# Patient Record
Sex: Female | Born: 1965 | Race: Black or African American | Hispanic: No | Marital: Married | Smoking: Never smoker
Health system: Southern US, Community
[De-identification: ages and names within clinical notes are randomized; demographics above are authoritative.]

## PROBLEM LIST (undated history)

## (undated) DIAGNOSIS — O1490 Unspecified pre-eclampsia, unspecified trimester: Secondary | ICD-10-CM

## (undated) DIAGNOSIS — I10 Essential (primary) hypertension: Secondary | ICD-10-CM

## (undated) HISTORY — DX: Essential (primary) hypertension: I10

## (undated) HISTORY — PX: REDUCTION MAMMAPLASTY: SUR839

## (undated) HISTORY — DX: Unspecified pre-eclampsia, unspecified trimester: O14.90

---

## 2010-04-04 ENCOUNTER — Emergency Department (HOSPITAL_BASED_OUTPATIENT_CLINIC_OR_DEPARTMENT_OTHER): Admission: EM | Admit: 2010-04-04 | Discharge: 2010-04-04 | Payer: Self-pay | Admitting: Emergency Medicine

## 2010-08-07 ENCOUNTER — Ambulatory Visit: Payer: Self-pay | Admitting: Family Medicine

## 2010-08-07 ENCOUNTER — Encounter: Payer: Self-pay | Admitting: Family Medicine

## 2010-08-07 DIAGNOSIS — R42 Dizziness and giddiness: Secondary | ICD-10-CM

## 2010-08-07 DIAGNOSIS — I951 Orthostatic hypotension: Secondary | ICD-10-CM | POA: Insufficient documentation

## 2010-08-08 ENCOUNTER — Telehealth (INDEPENDENT_AMBULATORY_CARE_PROVIDER_SITE_OTHER): Payer: Self-pay | Admitting: *Deleted

## 2010-08-08 LAB — CONVERTED CEMR LAB
BUN: 8 mg/dL (ref 6–23)
Basophils Relative: 0.4 % (ref 0.0–3.0)
Chloride: 105 meq/L (ref 96–112)
Eosinophils Absolute: 0.1 10*3/uL (ref 0.0–0.7)
Eosinophils Relative: 1.2 % (ref 0.0–5.0)
Glucose, Bld: 72 mg/dL (ref 70–99)
Lymphocytes Relative: 43.8 % (ref 12.0–46.0)
Neutrophils Relative %: 48.3 % (ref 43.0–77.0)
Potassium: 4.7 meq/L (ref 3.5–5.1)
RBC: 4.46 M/uL (ref 3.87–5.11)
Sodium: 140 meq/L (ref 135–145)
TSH: 1.27 microintl units/mL (ref 0.35–5.50)
WBC: 7.7 10*3/uL (ref 4.5–10.5)

## 2010-08-19 ENCOUNTER — Telehealth: Payer: Self-pay | Admitting: Family Medicine

## 2010-10-28 NOTE — Progress Notes (Signed)
Summary: still dizzy  Phone Note Call from Patient Call back at Work Phone [phone]   Caller: Patient Summary of Call: Pt states that she is still struggling with dizziness. Pt would like to know what is the next step. Pls advise.........Marland KitchenFelecia Deloach CMA  August 19, 2010 8:26 AM    Follow-up for Phone Call        are pt's sxs the same as when she was here before?  has she increased fluid intake, changed diet as discussed at OV?  if still dizzy despite these changes [facility] w/u. Follow-up by: Neena Rhymes MD,  August 19, 2010 8:47 AM  Additional Follow-up for Phone Call Additional follow up Details #1::        pt aware referral put in awaiting appt info.............Marland KitchenFelecia Deloach CMA  August 19, 2010 9:05 AM

## 2010-10-28 NOTE — Progress Notes (Signed)
Summary: 11-11--lab results  Phone Note Outgoing Call   Call placed by: Jeremy Johann CMA,  August 08, 2010 8:34 AM Details for Reason: labs normal.  no anemia, thyroid abnormality, or electrolyte problem that would explain dizziness Summary of Call: left message to call office ..............Marland KitchenFelecia Deloach CMA  August 08, 2010 8:34 AM   Follow-up for Phone Call        will mail information ............Marland KitchenDoristine Devoid CMA  August 11, 2010 3:37 PM

## 2010-10-28 NOTE — Assessment & Plan Note (Signed)
Summary: new to est//dizziness//lch   Vital Signs:  Patient profile:   45 year old female Weight:      154 pounds O2 Sat:      100 % on Room air Temp:     98.2 degrees F oral Pulse rate:   65 / minute BP supine:   140 / 82  (right arm) BP sitting:   130 / 86  (left arm) BP standing:   120 / 82  (left arm)  Vitals Entered By: Jeremy Johann CMA (August 07, 2010 1:14 PM)  O2 Flow:  Room air CC: new to establish, dizziness, trouble with vision x2 weeks Comments orthostatic vitals: standing pulse 82, o2 100, laying pulse 67 o2 97   History of Present Illness: 45 yo woman here today to establish care.  previous MD- Loralyn Freshwater, [facility] (last seen there Monday).  elevated BP- developed pre-eclampsia during 1st pregnancy.  has been on and off meds for 10 yrs.  recently on Bystolic.  developed dizziness on 10/27 while getting off an elevator.  then dizziness persisted throughout the day.  would have some improvement in the evenings.  stopped meds on 11/4.  was also having nasal congestion so started on Allegra.  still having dizziness.  able to run on treadmill in the AM w/out difficulty.  dizziness will worsen between 2-3pm.  BP has remained normal off the meds.  dizziness worse w/ position changes.  drinks 32 oz of water daily.  admits to snacking on sweets frequently.  Preventive Screening-Counseling & Management  Alcohol-Tobacco     Alcohol drinks/day: 0     Smoking Status: never  Caffeine-Diet-Exercise     Does Patient Exercise: yes     Type of exercise: treadmill     Times/week: 3      Sexual History:  currently monogamous.        Drug Use:  never.    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 No apparent alopecia or balding. Eyes:  PERRL, EOMI, fundi WNL Ears:  External ear exam shows no significant lesions or deformities.  Otoscopic examination reveals clear canals, tympanic membranes are intact bilaterally without bulging, retraction, inflammation or discharge. Hearing is grossly normal bilaterally. Nose:  External nasal examination shows no deformity or inflammation. Nasal mucosa are pink and moist without lesions or exudates. Neck:  No deformities, masses, or tenderness noted. Lungs:  Normal respiratory effort, chest expands symmetrically. Lungs are clear to auscultation, no crackles or wheezes. Heart:  Normal rate and regular rhythm. S1 and S2 normal without gallop, murmur, click, rub or other extra sounds. Pulses:  +2 carotid, radial, DP Extremities:  no C/C/E Neurologic:  No cranial nerve deficits noted. Station and gait are normal. Plantar reflexes are down-going bilaterally. DTRs are symmetrical throughout. Sensory, motor and coordinative functions appear intact.   Impression & Recommendations:  Problem # 1:  HYPOTENSION-ORTHOSTATIC (ICD-458.0)  Assessment New BP drops 20 pts between lying and standing.  EKG normal.  check CBC to r/o anemia.  encouraged pt to increase fluid intake and change positions slowly. Orders: Venipuncture (33295) Specimen Handling (18841) EKG w/ Interpretation (93000) TLB-CBC Platelet - w/Differential (85025-CBCD)  Problem # 2:  DIZZINESS (ICD-780.4) Assessment: New likely due to orthostasis but may also be related to pt's  poor eating habits and glucose fluctuations.  check TSH to r/o thyroid d/o and BMP to r/o problems w/ electrolytes.  encouraged regular snacking w/ some sort of protein and avoiding simple sugars that will cause large fluctuations in blood sugar.  will follow. Orders: Specimen Handling (66063) EKG w/ Interpretation (93000) TLB-BMP (Basic Metabolic Panel-BMET) (80048-METABOL) TLB-TSH (Thyroid Stimulating Hormone) (84443-TSH)  Patient Instructions: 1)  Follow up in 3-4 weeks to recheck your dizziness 2)  Increase your fluid intake 3)  Change positions slowly 4)  Eat a mid morning and mid afternoon snack that includes protein- PB crackers, crackers and cheese, fruit and PB 5)  Try and avoid eating sweets at these times b/c it will cause rapid shifts in blood sugar 6)  We'll notify you of your lab results 7)  Welcome!  We're glad to have you!   Orders Added: 1)  Venipuncture [01601] 2)  Specimen Handling [99000] 3)  EKG w/ Interpretation [93000] 4)  TLB-CBC Platelet - w/Differential [85025-CBCD] 5)  TLB-BMP (Basic Metabolic Panel-BMET) [80048-METABOL] 6)  TLB-TSH (Thyroid Stimulating Hormone) [84443-TSH] 7)  New Patient Level II [09323]

## 2011-02-27 ENCOUNTER — Ambulatory Visit (INDEPENDENT_AMBULATORY_CARE_PROVIDER_SITE_OTHER): Payer: BC Managed Care – PPO | Admitting: Family Medicine

## 2011-02-27 ENCOUNTER — Encounter: Payer: Self-pay | Admitting: *Deleted

## 2011-02-27 ENCOUNTER — Ambulatory Visit (HOSPITAL_BASED_OUTPATIENT_CLINIC_OR_DEPARTMENT_OTHER)
Admission: RE | Admit: 2011-02-27 | Discharge: 2011-02-27 | Disposition: A | Discharge status: Home / Self Care | Payer: BC Managed Care – PPO | Source: Ambulatory Visit | Attending: Family Medicine | Admitting: Family Medicine

## 2011-02-27 DIAGNOSIS — R2230 Localized swelling, mass and lump, unspecified upper limb: Secondary | ICD-10-CM | POA: Insufficient documentation

## 2011-02-27 DIAGNOSIS — R229 Localized swelling, mass and lump, unspecified: Secondary | ICD-10-CM

## 2011-02-27 DIAGNOSIS — I10 Essential (primary) hypertension: Secondary | ICD-10-CM | POA: Insufficient documentation

## 2011-02-27 MED ORDER — HYDROCHLOROTHIAZIDE 12.5 MG PO CAPS: 12.5000 mg | ORAL_CAPSULE | Freq: Every day | ORAL | Status: DC

## 2011-02-27 NOTE — Progress Notes (Signed)
  Subjective:    Patient ID: Peggy Duncan, female    DOB: [birth year], 45 y.o.   MRN: [id number]  HPI Elevated BP-  Reports BP has been 'steadily increasing over the last 2 months'.  Is taking BP at work.  Highest reading was 157.  Reports work is very stressful for her.  Has previously been on BP meds.  No CP, SOB, HAs, visual changes.  Some R ankle edema.  Swelling under R arm- first noticed 6 weeks ago in shower.  Not painful.  Was more noticeable around menses.  Concerned b/c recently multiple friends and family members have been dx'd w/ cancer.  Had recent mammo that was normal   Review of Systems For ROS see HPI     Objective:   Physical Exam  Constitutional: She is oriented to person, place, and time. She appears well-developed and well-nourished. No distress.  HENT:  Head: Normocephalic and atraumatic.  Eyes: Conjunctivae and EOM are normal. Pupils are equal, round, and reactive to light.  Neck: Normal range of motion. Neck supple. No thyromegaly present.  Cardiovascular: Normal rate, regular rhythm, normal heart sounds and intact distal pulses.   No murmur heard. Pulmonary/Chest: Effort normal and breath sounds normal. No respiratory distress.  Abdominal: Soft. She exhibits no distension. There is no tenderness.  Musculoskeletal: She exhibits no edema.  Lymphadenopathy:    She has no cervical adenopathy.       Right: No inguinal and no supraclavicular adenopathy present.       Left: No inguinal and no supraclavicular adenopathy present.       No obvious LAD but diffuse soft tissue mass/swelling in R axilla, ~ 3.5 cm.  Nontender.  Neurological: She is alert and oriented to person, place, and time.  Skin: Skin is warm and dry.  Psychiatric: She has a normal mood and affect. Her behavior is normal.          Assessment & Plan:

## 2011-02-27 NOTE — Patient Instructions (Signed)
Follow up in 3 weeks to recheck your blood pressure and check your lab work Someone will call you with your ultrasound appt Start the Hydrochlorothiazide daily for blood pressure Drink plenty of fluids Call with any questions or concerns Hang in there!!!

## 2011-03-01 NOTE — Assessment & Plan Note (Addendum)
Discussed w/ pt the possibility of LAD, lipoma, extraneous breast tissue.  Will get [place] to assess.  No need for abx as it is not red, warm or tender.  Will determine next step in plan based on [place] report.  Pt expressed understanding and is in agreement w/ plan.

## 2011-03-01 NOTE — Assessment & Plan Note (Signed)
Given that pt's BP has been persistently elevated at work will start low dose HCTZ and monitor closely.

## 2011-12-09 ENCOUNTER — Encounter: Payer: Self-pay | Admitting: Family Medicine

## 2011-12-09 ENCOUNTER — Ambulatory Visit (INDEPENDENT_AMBULATORY_CARE_PROVIDER_SITE_OTHER): Payer: BC Managed Care – PPO | Admitting: Family Medicine

## 2011-12-09 VITALS — BP 120/77 | HR 93 | Temp 98.4°F | Ht 64.5 in | Wt 161.6 lb

## 2011-12-09 DIAGNOSIS — L723 Sebaceous cyst: Secondary | ICD-10-CM

## 2011-12-09 DIAGNOSIS — IMO0002 Reserved for concepts with insufficient information to code with codable children: Secondary | ICD-10-CM

## 2011-12-09 MED ORDER — AMOXICILLIN-POT CLAVULANATE 875-125 MG PO TABS: 1.0000 | ORAL_TABLET | Freq: Two times a day (BID) | ORAL | Status: AC

## 2011-12-09 NOTE — Assessment & Plan Note (Signed)
New.  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  These are often hormonally sensitive.  Differential dx includes LAD.  Start abx to see if tenderness improves.  Offered surgical consult- pt not interested at this time.  Will continue to follow.

## 2011-12-09 NOTE — Patient Instructions (Signed)
Schedule your complete physical for June Start the augmentin twice daily- take w/ food Hot compresses to the area to help w/ swelling Sometimes these drain- that's ok Call with any questions or concerns Hang in there!!!

## 2011-12-09 NOTE — Progress Notes (Signed)
  Subjective:    Patient ID: Peggy Duncan, female    DOB: July 05, 1966, 46 y.o.   MRN: [id number]  HPI Lump in groin- 1st noticed 1 month ago.  Resolved spontaneously.  Returned.  Occurs near menses.  Not painful but irritates along panty line.  No drainage.  No redness.  No fever.  No hx of similar.  No other areas.   Review of Systems For ROS see HPI     Objective:   Physical Exam  Constitutional: She appears well-developed and well-nourished. No distress.  Skin: Skin is warm and dry.       1 cm firm, round area in L inguinal crease consistent w/ follicular cyst vs LAD.  Mildly TTP.  No overlying redness.          Assessment & Plan:

## 2012-03-01 ENCOUNTER — Ambulatory Visit (INDEPENDENT_AMBULATORY_CARE_PROVIDER_SITE_OTHER): Payer: BC Managed Care – PPO | Admitting: Family Medicine

## 2012-03-01 VITALS — BP 124/85 | HR 73 | Temp 98.8°F | Ht 63.5 in | Wt 163.8 lb

## 2012-03-01 DIAGNOSIS — J040 Acute laryngitis: Secondary | ICD-10-CM | POA: Insufficient documentation

## 2012-03-01 DIAGNOSIS — J302 Other seasonal allergic rhinitis: Secondary | ICD-10-CM

## 2012-03-01 DIAGNOSIS — J309 Allergic rhinitis, unspecified: Secondary | ICD-10-CM

## 2012-03-01 MED ORDER — BENZONATATE 200 MG PO CAPS: 200.0000 mg | ORAL_CAPSULE | Freq: Three times a day (TID) | ORAL | Status: AC | PRN

## 2012-03-01 NOTE — Assessment & Plan Note (Signed)
New.  Likely due to PND.  Reviewed supportive care and red flags that should prompt return.  Pt expressed understanding and is in agreement w/ plan.

## 2012-03-01 NOTE — Progress Notes (Signed)
  Subjective:    Patient ID: Peggy Duncan, female    DOB: 1966/05/30, 46 y.o.   MRN: [id number]  HPI Laryngitis- started Saturday, some increased cough.  Cough initially started 3 weeks ago and saw RN at work and was told it PND.  Was taking allergy meds and sxs cleared up.  Stopped meds 1 week ago and sxs returned.  No fevers.  No throat pain.  + cough, particularly at night.  No ear pain, facial pain.   Review of Systems For ROS see HPI     Objective:   Physical Exam  Vitals reviewed. Constitutional: She appears well-developed and well-nourished. No distress.  HENT:  Head: Normocephalic and atraumatic.  Right Ear: Tympanic membrane normal.  Left Ear: Tympanic membrane normal.  Nose: Mucosal edema and rhinorrhea present. Right sinus exhibits no maxillary sinus tenderness and no frontal sinus tenderness. Left sinus exhibits no maxillary sinus tenderness and no frontal sinus tenderness.  Mouth/Throat: Mucous membranes are normal. Posterior oropharyngeal erythema (w/ PND) present.  Eyes: Conjunctivae and EOM are normal. Pupils are equal, round, and reactive to light.  Neck: Normal range of motion. Neck supple.  Cardiovascular: Normal rate, regular rhythm and normal heart sounds.   Pulmonary/Chest: Effort normal and breath sounds normal. No respiratory distress. She has no wheezes. She has no rales.  Lymphadenopathy:    She has no cervical adenopathy.          Assessment & Plan:

## 2012-03-01 NOTE — Assessment & Plan Note (Signed)
New.  Likely the cause of her laryngitis.  Restart OTC antihistamine.  Reviewed supportive care and red flags that should prompt return.  Pt expressed understanding and is in agreement w/ plan.

## 2012-03-01 NOTE — Patient Instructions (Signed)
This is all due to your post nasal drip/allergies Restart the Claritin or Zyrtec daily Drink plenty of fluids Rest your voice as much as possible Alternate tylenol and ibuprofen as needed for pain/hoarseness Take the cough pills as needed Call with any questions or concerns Happy early birthday!!!

## 2012-03-20 IMAGING — US US EXTREM UP *R* LTD
1 series · 14 of 15 positions shown · non-contrast
Comparison: None.

CLINICAL DATA: Right axillary mass.

ULTRASOUND RIGHT UPPER EXTREMITY COMPLETE
TECHNIQUE: Ultrasound examination of the right axilla was
performed including evaluation of the muscles, tendons, joint, and
adjacent soft tissues.

[Series 1: us extrem up *right* ltd · 0.07mm/px · 14 of 15 slices shown]
[im 1/15]
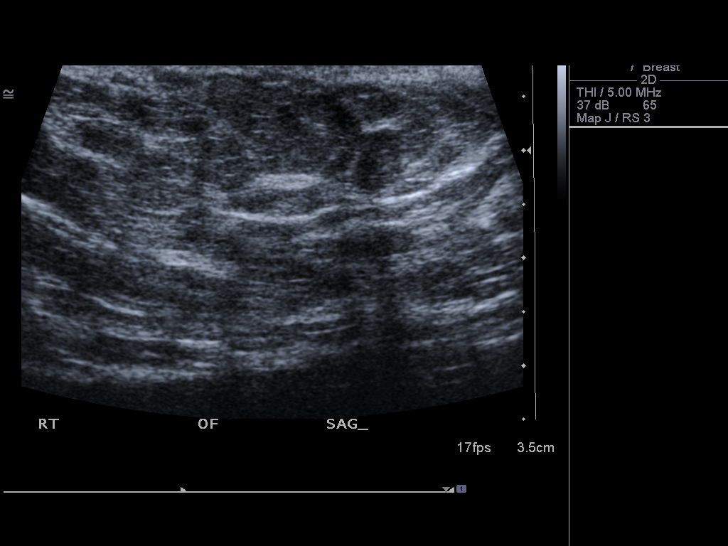
[im 2/15]
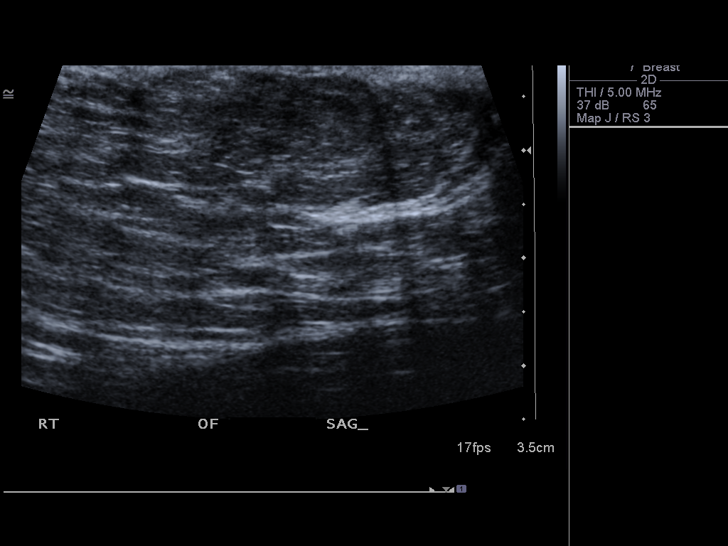
[im 3/15]
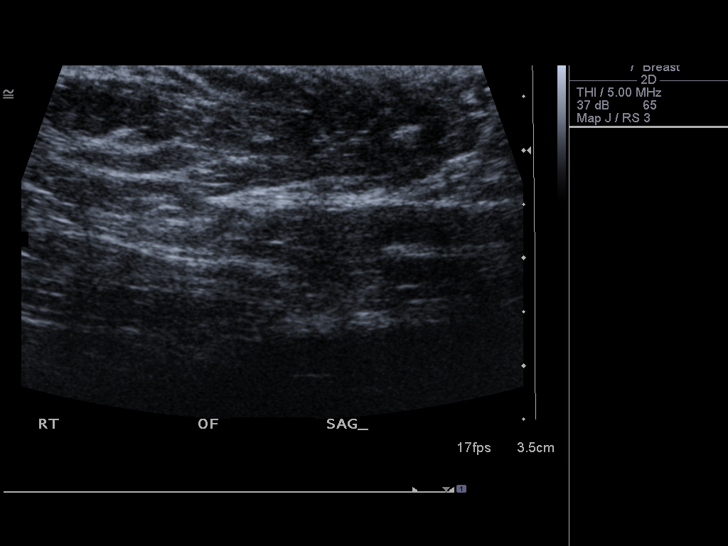
[im 4/15]
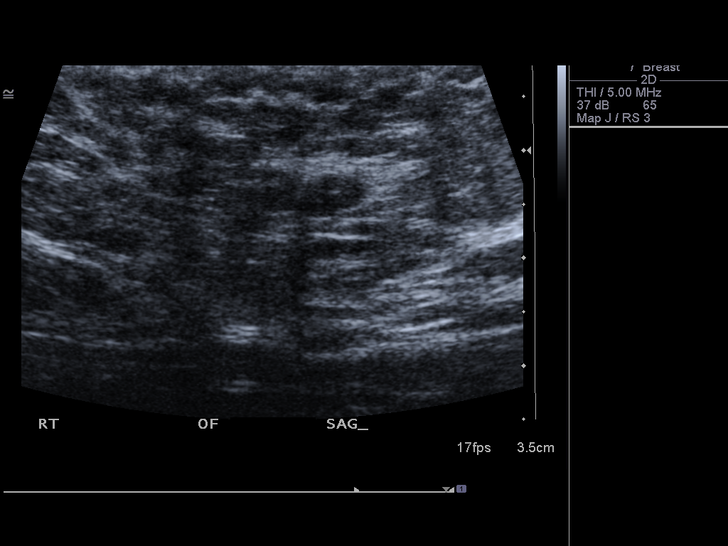
[im 5/15]
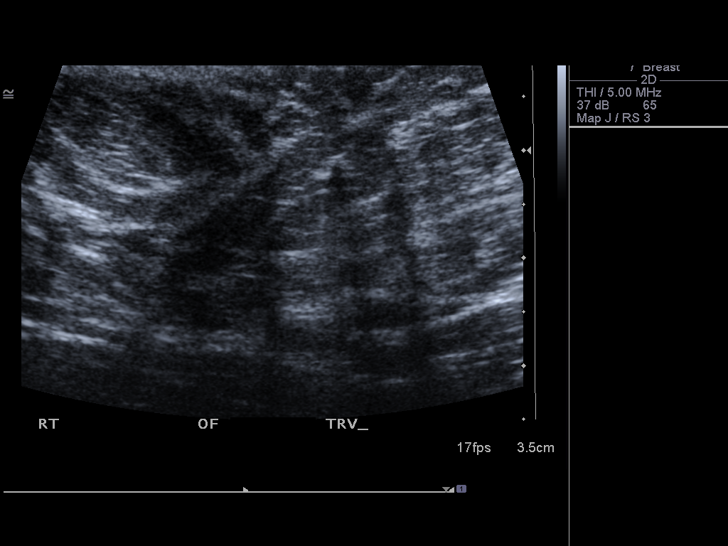
[im 6/15]
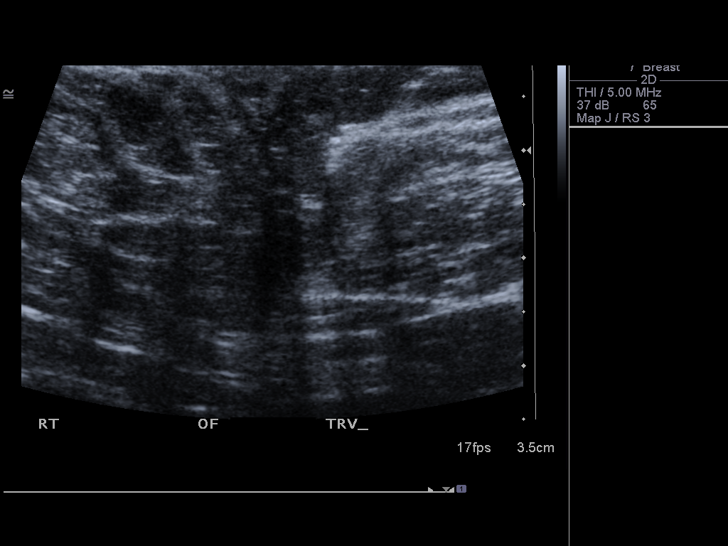
[im 7/15]
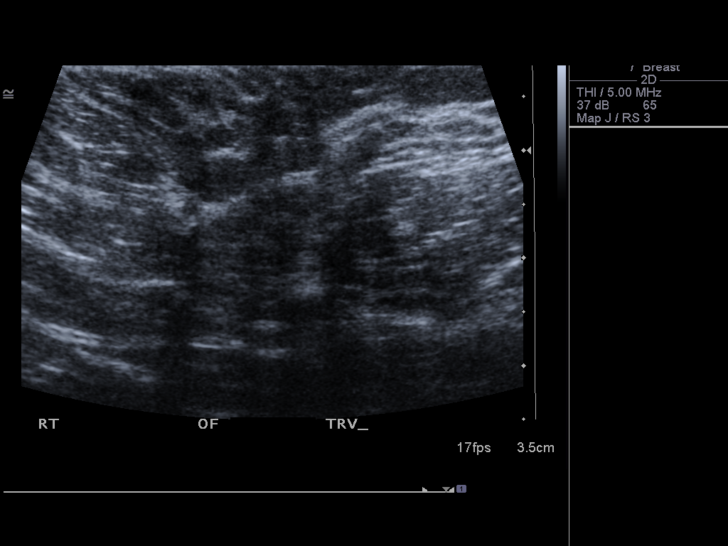
[im 9/15]
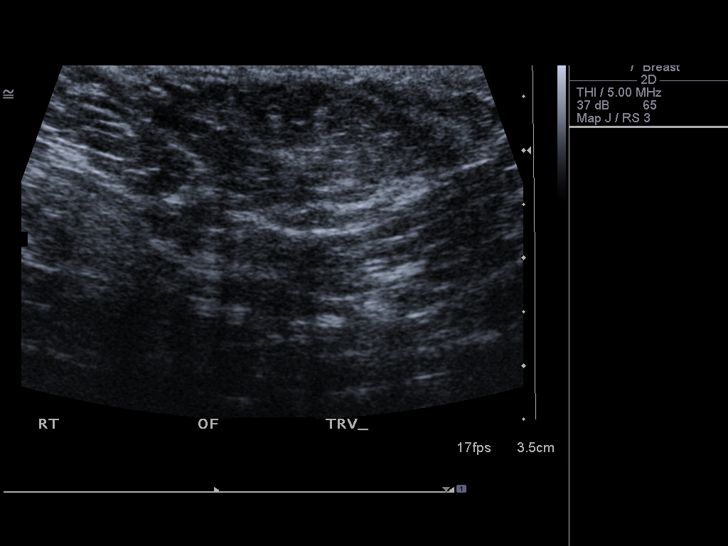
[im 10/15]
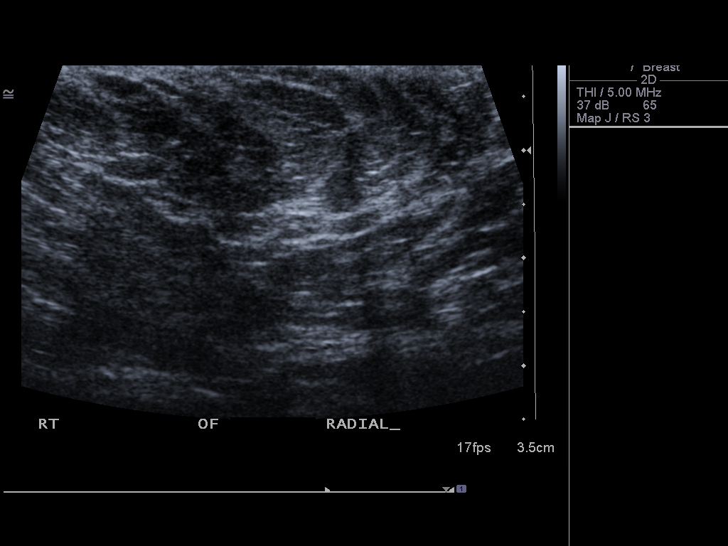
[im 11/15]
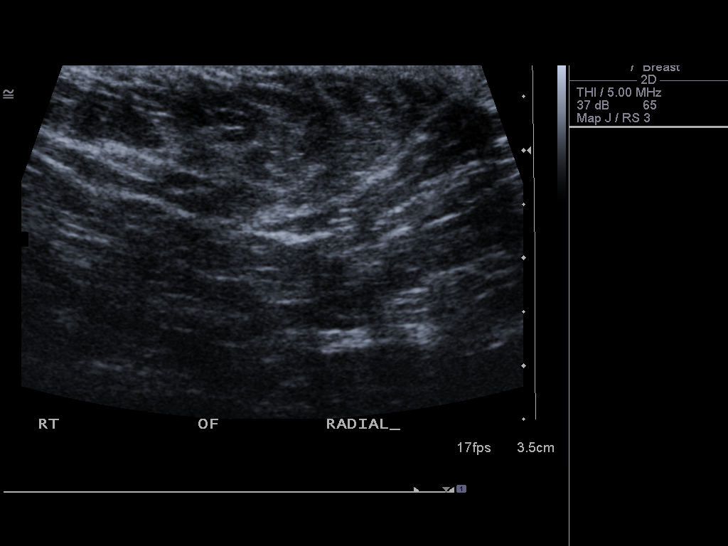
[im 12/15]
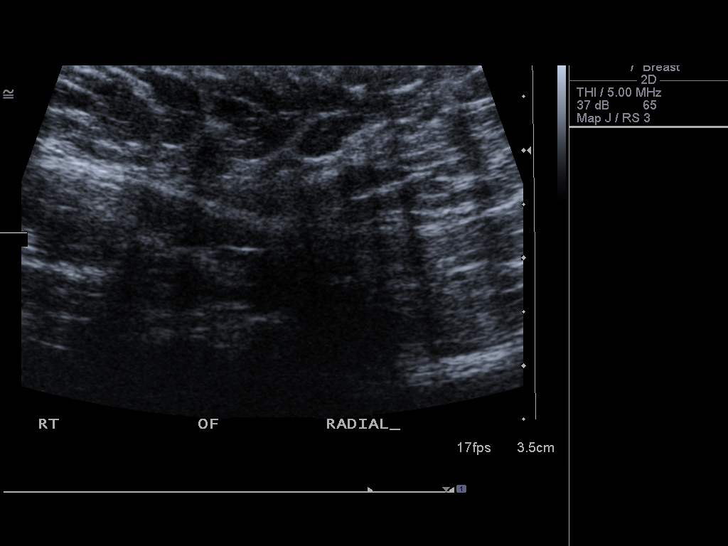
[im 13/15]
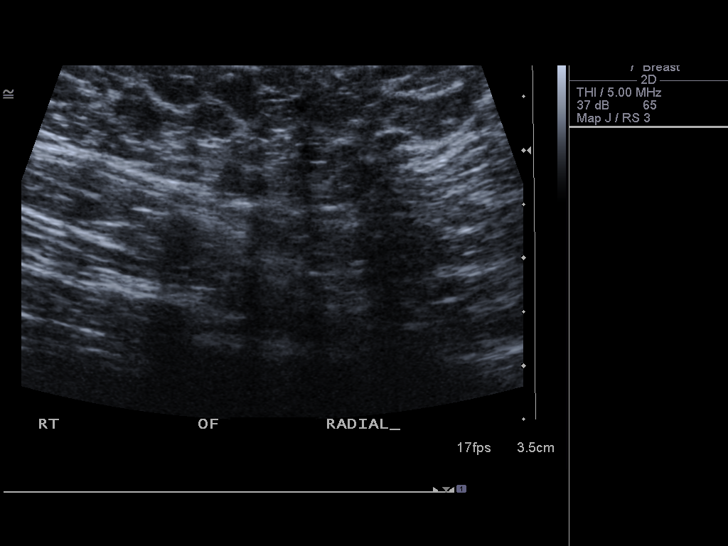
[im 14/15]
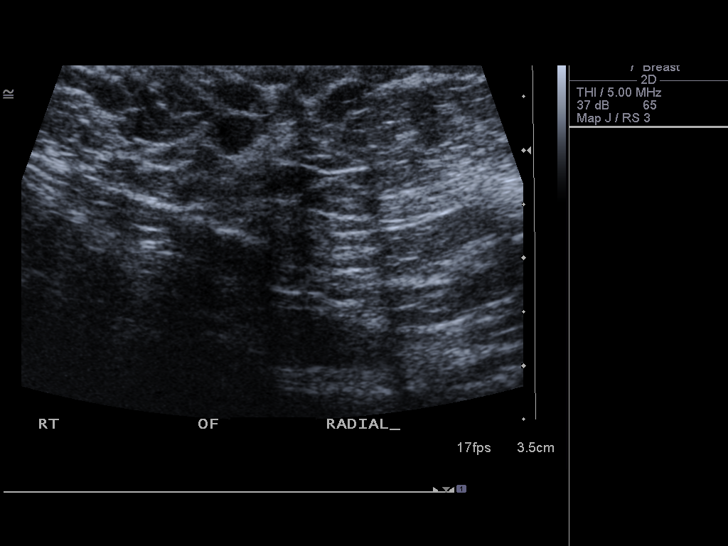
[im 15/15]
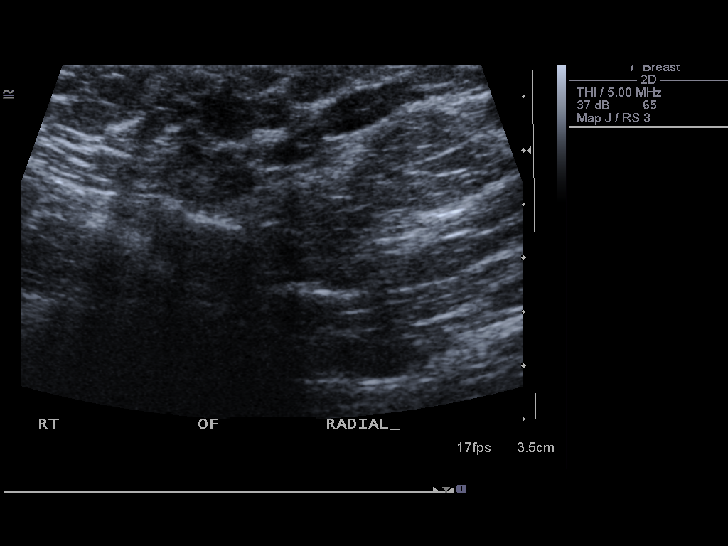

[14 of 15 positions shown; findings below may reference images not displayed]

FINDINGS: There is no mass lesion identified.  Prominent
subcutaneous fat is present over the area of palpable abnormality.
There is no discernible lesion that can be separated from adjacent
subcutaneous fat.
IMPRESSION: Prominent subcutaneous fat in the area of palpable abnormality.  No
mass lesion.

## 2012-07-25 ENCOUNTER — Other Ambulatory Visit: Payer: Self-pay | Admitting: Family Medicine

## 2012-07-25 NOTE — Telephone Encounter (Signed)
refill Hydrochlorothiazide (Cap) 12.5 MG Take 1 capsule (12.5 mg total) by mouth daily. #30 last fill 03.05.13--last ov 6.4.13 acute

## 2012-07-26 NOTE — Telephone Encounter (Signed)
Called pt to verify pharmacy left message on home phone awaiting pt call.    MW

## 2012-07-27 MED ORDER — HYDROCHLOROTHIAZIDE 12.5 MG PO CAPS: 12.5000 mg | ORAL_CAPSULE | Freq: Every day | ORAL | Status: DC

## 2012-07-27 NOTE — Telephone Encounter (Signed)
rx sent to pharmacy by e-script  

## 2013-06-05 LAB — HM MAMMOGRAPHY

## 2013-06-05 LAB — HM PAP SMEAR: HM PAP: NORMAL

## 2013-06-09 ENCOUNTER — Other Ambulatory Visit: Payer: Self-pay | Admitting: Family Medicine

## 2013-06-09 NOTE — Telephone Encounter (Signed)
Pt has not been seen since 02/2012 Med last filled 07/27/12

## 2013-06-09 NOTE — Telephone Encounter (Signed)
Pt needs OV prior to any refills since it has been over a year

## 2013-06-12 NOTE — Telephone Encounter (Signed)
Pt.notified

## 2013-09-25 ENCOUNTER — Other Ambulatory Visit: Payer: Self-pay | Admitting: General Practice

## 2013-09-25 ENCOUNTER — Ambulatory Visit (INDEPENDENT_AMBULATORY_CARE_PROVIDER_SITE_OTHER): Payer: BC Managed Care – PPO | Admitting: Family Medicine

## 2013-09-25 ENCOUNTER — Encounter: Payer: Self-pay | Admitting: Family Medicine

## 2013-09-25 VITALS — BP 120/80 | HR 100 | Temp 98.4°F | Resp 16 | Wt 160.5 lb

## 2013-09-25 DIAGNOSIS — J209 Acute bronchitis, unspecified: Secondary | ICD-10-CM

## 2013-09-25 DIAGNOSIS — J029 Acute pharyngitis, unspecified: Secondary | ICD-10-CM

## 2013-09-25 MED ORDER — PROMETHAZINE-DM 6.25-15 MG/5ML PO SYRP: 5.0000 mL | ORAL_SOLUTION | Freq: Four times a day (QID) | ORAL | Status: DC | PRN

## 2013-09-25 MED ORDER — HYDROCHLOROTHIAZIDE 12.5 MG PO CAPS: 12.5000 mg | ORAL_CAPSULE | Freq: Every day | ORAL | Status: DC

## 2013-09-25 NOTE — Progress Notes (Signed)
Pre visit review using our clinic review tool, if applicable. No additional management support is needed unless otherwise documented below in the visit note. 

## 2013-09-25 NOTE — Progress Notes (Signed)
   Subjective:    Patient ID: Peggy Duncan, female    DOB: [birth year], 47 y.o.   MRN: [id number]  HPI URI- sxs started 1 week ago.  + nasal congestion, PND.  Mild sinus pressure, HA w/ cough.  Cough is intermittently productive.  No fevers.  No N/V/D.  + sore throat.  No known sick contacts.   Review of Systems For ROS see HPI     Objective:   Physical Exam  Vitals reviewed. Constitutional: She appears well-developed and well-nourished. No distress.  HENT:  Head: Normocephalic and atraumatic.  Nose: Nose normal.  TMs normal bilaterally No TTP over sinuses Bilateral tonsillar enlargement w/ erythema and exudate  Neck: Normal range of motion. Neck supple.  Cardiovascular: Normal rate, regular rhythm and normal heart sounds.   Pulmonary/Chest: Effort normal and breath sounds normal. No respiratory distress. She has no wheezes. She has no rales.  + hacking cough  Lymphadenopathy:    She has cervical adenopathy.          Assessment & Plan:

## 2013-09-25 NOTE — Assessment & Plan Note (Signed)
New.  Despite pt's enlarged tonsils, rapid strep was negative and she also has deep cough which was her primary complaint.  No other evidence of potential bacterial infxn.  Suspect viral illness.  Start cough meds prn.  Reviewed supportive care and red flags that should prompt return.  Pt expressed understanding and is in agreement w/ plan.

## 2013-09-25 NOTE — Patient Instructions (Signed)
Follow up as needed There is no evidence of bacterial infection (including strep) so there is no need for an antibiotic Drink plenty of fluids REST! Mucinex DM for daytime cough, syrup for nights and weekends (sent to pharmacy) Ibuprofen as needed for headache and pain caused by cough Call with any questions or concerns Hang in there! Happy New Year!

## 2013-10-18 ENCOUNTER — Other Ambulatory Visit: Payer: Self-pay | Admitting: General Practice

## 2013-10-18 MED ORDER — HYDROCHLOROTHIAZIDE 12.5 MG PO CAPS: 12.5000 mg | ORAL_CAPSULE | Freq: Every day | ORAL | Status: DC

## 2014-07-30 ENCOUNTER — Encounter: Payer: Self-pay | Admitting: Family Medicine

## 2015-05-07 ENCOUNTER — Emergency Department (HOSPITAL_COMMUNITY)
Admission: EM | Admit: 2015-05-07 | Discharge: 2015-05-07 | Disposition: A | Discharge status: Home / Self Care | Payer: BLUE CROSS/BLUE SHIELD | Attending: Emergency Medicine | Admitting: Emergency Medicine

## 2015-05-07 ENCOUNTER — Telehealth: Payer: Self-pay | Admitting: Family Medicine

## 2015-05-07 ENCOUNTER — Ambulatory Visit: Payer: Self-pay | Admitting: Family

## 2015-05-07 ENCOUNTER — Encounter (HOSPITAL_COMMUNITY): Payer: Self-pay | Admitting: Physical Medicine and Rehabilitation

## 2015-05-07 ENCOUNTER — Telehealth: Payer: Self-pay | Admitting: Family

## 2015-05-07 DIAGNOSIS — R42 Dizziness and giddiness: Secondary | ICD-10-CM

## 2015-05-07 DIAGNOSIS — Z3202 Encounter for pregnancy test, result negative: Secondary | ICD-10-CM | POA: Diagnosis not present

## 2015-05-07 DIAGNOSIS — I10 Essential (primary) hypertension: Secondary | ICD-10-CM | POA: Diagnosis present

## 2015-05-07 LAB — POC URINE PREG, ED: PREG TEST UR: NEGATIVE

## 2015-05-07 MED ORDER — HYDROCHLOROTHIAZIDE 12.5 MG PO CAPS: 12.5000 mg | ORAL_CAPSULE | Freq: Every day | ORAL | Status: DC

## 2015-05-07 NOTE — Telephone Encounter (Signed)
Patient presented to [facility] ED.

## 2015-05-07 NOTE — Telephone Encounter (Signed)
Patient Name: Peggy Duncan DOB: 1965-12-31 Initial Comment Caller states the patient is having high BP. 180/110 and also lightheaded and having a headache Nurse Assessment Nurse: Marcelline Deist, RN, Kermit Balo Date/Time (Eastern Time): 05/07/2015 11:59:23 AM Confirm and document reason for call. If symptomatic, describe symptoms. ---Caller states the patient is having high BP, 180/110 and also lightheaded and having a headache. Has been under some stress. Normally has a BP of 140's/80's. Takes a BP rx a couple times a week. Wondering if she is going through a change of life, also. Has the patient traveled out of the country within the last 30 days? ---Not Applicable Does the patient require triage? ---Yes Related visit to physician within the last 2 weeks? ---No Does the PT have any chronic conditions? (i.e. diabetes, asthma, etc.) ---Yes List chronic conditions. ---BP rx. Did the patient indicate they were pregnant? ---No Guidelines Guideline Title Affirmed Question Affirmed Notes High Blood Pressure [1] BP # 160 / 100 AND [2] cardiac or neurologic symptoms (e.g., chest pain, difficulty breathing, unsteady gait, blurred vision) Final Disposition User Go to ED Now Marcelline Deist, RN, Lynda Comments Caller is not sure what to do. asked if she could be seen at an UC. Nurse has to advise being seen in an ER, told patient that many times an UC will refer her to be seen in ER based on symptoms. Please contact patient for further advice/feedback. She may go ahead to the medical facility & be seen. Referrals GO TO FACILITY UNDECIDED REFERRED TO PCP OFFICE Disagree/Comply: Comply

## 2015-05-07 NOTE — Discharge Instructions (Signed)
1. Medications: HCTZ, usual home medications 2. Treatment: rest, drink plenty of fluids 3. Follow Up: Please followup with your primary doctor in 2 days for discussion of your diagnoses and further evaluation after today's visit; if you do not have a primary care doctor use the resource guide provided to find one; Please return to the ER for worsening symptoms    DASH Eating Plan DASH stands for "Dietary Approaches to Stop Hypertension." The DASH eating plan is a healthy eating plan that has been shown to reduce high blood pressure (hypertension). Additional health benefits may include reducing the risk of type 2 diabetes mellitus, heart disease, and stroke. The DASH eating plan may also help with weight loss. WHAT DO I NEED TO KNOW ABOUT THE DASH EATING PLAN? For the DASH eating plan, you will follow these general guidelines:  Choose foods with a percent daily value for sodium of less than 5% (as listed on the food label).  Use salt-free seasonings or herbs instead of table salt or sea salt.  Check with your health care provider or pharmacist before using salt substitutes.  Eat lower-sodium products, often labeled as "lower sodium" or "no salt added."  Eat fresh foods.  Eat more vegetables, fruits, and low-fat dairy products.  Choose whole grains. Look for the word "whole" as the first word in the ingredient list.  Choose fish and skinless chicken or [place] more often than red meat. Limit fish, poultry, and meat to 6 oz (170 g) each day.  Limit sweets, desserts, sugars, and sugary drinks.  Choose heart-healthy fats.  Limit cheese to 1 oz (28 g) per day.  Eat more home-cooked food and less restaurant, buffet, and fast food.  Limit fried foods.  Cook foods using methods other than frying.  Limit canned vegetables. If you do use them, rinse them well to decrease the sodium.  When eating at a restaurant, ask that your food be prepared with less salt, or no salt if  possible. WHAT FOODS CAN I EAT? Seek help from a dietitian for individual calorie needs. Grains Whole grain or whole wheat bread. Brown rice. Whole grain or whole wheat pasta. Quinoa, bulgur, and whole grain cereals. Low-sodium cereals. Corn or whole wheat flour tortillas. Whole grain cornbread. Whole grain crackers. Low-sodium crackers. Vegetables Fresh or frozen vegetables (raw, steamed, roasted, or grilled). Low-sodium or reduced-sodium tomato and vegetable juices. Low-sodium or reduced-sodium tomato sauce and paste. Low-sodium or reduced-sodium canned vegetables.  Fruits All fresh, canned (in natural juice), or frozen fruits. Meat and Other Protein Products Ground beef (85% or leaner), grass-fed beef, or beef trimmed of fat. Skinless chicken or [place]. Ground chicken or [place]. Pork trimmed of fat. All fish and seafood. Eggs. Dried beans, peas, or lentils. Unsalted nuts and seeds. Unsalted canned beans. Dairy Low-fat dairy products, such as skim or 1% milk, 2% or reduced-fat cheeses, low-fat ricotta or cottage cheese, or plain low-fat yogurt. Low-sodium or reduced-sodium cheeses. Fats and Oils Tub margarines without trans fats. Light or reduced-fat mayonnaise and salad dressings (reduced sodium). Avocado. Safflower, olive, or canola oils. Natural peanut or almond butter. Other Unsalted popcorn and pretzels. The items listed above may not be a complete list of recommended foods or beverages. Contact your dietitian for more options. WHAT FOODS ARE NOT RECOMMENDED? Grains White bread. White pasta. White rice. Refined cornbread. Bagels and croissants. Crackers that contain trans fat. Vegetables Creamed or fried vegetables. Vegetables in a cheese sauce. Regular canned vegetables. Regular canned tomato sauce and paste. Regular  tomato and vegetable juices. Fruits Dried fruits. Canned fruit in light or heavy syrup. Fruit juice. Meat and Other Protein Products Fatty cuts of meat. Ribs, chicken  wings, bacon, sausage, bologna, salami, chitterlings, fatback, hot dogs, bratwurst, and packaged luncheon meats. Salted nuts and seeds. Canned beans with salt. Dairy Whole or 2% milk, cream, half-and-half, and cream cheese. Whole-fat or sweetened yogurt. Full-fat cheeses or blue cheese. Nondairy creamers and whipped toppings. Processed cheese, cheese spreads, or cheese curds. Condiments Onion and garlic salt, seasoned salt, table salt, and sea salt. Canned and packaged gravies. Worcestershire sauce. Tartar sauce. Barbecue sauce. Teriyaki sauce. Soy sauce, including reduced sodium. Steak sauce. Fish sauce. Oyster sauce. Cocktail sauce. Horseradish. Ketchup and mustard. Meat flavorings and tenderizers. Bouillon cubes. Hot sauce. Tabasco sauce. Marinades. Taco seasonings. Relishes. Fats and Oils Butter, stick margarine, lard, shortening, ghee, and bacon fat. Coconut, palm kernel, or palm oils. Regular salad dressings. Other Pickles and olives. Salted popcorn and pretzels. The items listed above may not be a complete list of foods and beverages to avoid. Contact your dietitian for more information. WHERE CAN I FIND MORE INFORMATION? National Heart, Lung, and Blood Institute: travelstabloid.com Document Released: 09/03/2011 Document Revised: 01/29/2014 Document Reviewed: 07/19/2013 [facility] Patient Information 2015 [place], [place]. This information is not intended to replace advice given to you by your health care provider. Make sure you discuss any questions you have with your health care provider.

## 2015-05-07 NOTE — ED Provider Notes (Signed)
CSN: [id number]     Arrival date & time 05/07/15  1248 History   First MD Initiated Contact with Patient 05/07/15 1502     Chief Complaint  Patient presents with  . Hypertension     (Consider location/radiation/quality/duration/timing/severity/associated sxs/prior Treatment) The history is provided by the patient and medical records. No language interpreter was used.     Peggy Duncan is a 49 y.o. female  with a hx of HTN presents to the Emergency Department complaining of gradual, intermittent lightheadedness onset approx 1 week ago.  Pt reports she had her BP was checked at work due to these symptoms and it was 180/110 at 11:30am.  She spoke with her primary care physician who recommended that she present here to the ED.  Pt reports this reading made her very nervous and her BP continued to go up.  RN triage report states headache, but patient reports this was more lightheadedness and gentle pressure and not pain or aching.  Pt reports this was never thunderclap or sudden onset.  No blood thinners.  Pt reports she did not take any medication for this.  Pt reports taking HCTZ 12.5 at home for HTN however she reports that she was instructed only to take it when her BP was high.  Pt reports a large amount of stress in the last week with home and work.  She reports she takes the medication 2-3x per week, only as needed.  Pt reports taking the HCTZ this morning around 11am, but has not taken it at all for the last week.  Pt reports these tablets are > 89 year old.  Nothing makes her symptoms better or worse.  Pt denies fever chills, neck stiffness, CP, SOB, DOE, palpitations, leg swelling, vision changes, changes in her urine.  Pt denies regular occurrence of headaches.  Pt reports she is 1 week late on her menses and is sexually active without birth control.   Past Medical History  Diagnosis Date  . Hypertension   . Pre-eclampsia    History reviewed. No pertinent past surgical history. Family  History  Problem Relation Age of Onset  . Prostate cancer Father   . Hypertension Mother   . Pulmonary embolism Mother    History  Substance Use Topics  . Smoking status: Never Smoker   . Smokeless tobacco: Not on file  . Alcohol Use: No   OB History    Gravida Para Term Preterm AB TAB SAB Ectopic Multiple Living   2 2             Review of Systems  Constitutional: Negative for fever, diaphoresis, appetite change, fatigue and unexpected weight change.  HENT: Negative for mouth sores.   Eyes: Negative for visual disturbance.  Respiratory: Negative for cough, chest tightness, shortness of breath and wheezing.   Cardiovascular: Negative for chest pain.  Gastrointestinal: Negative for nausea, vomiting, abdominal pain, diarrhea and constipation.  Endocrine: Negative for polydipsia, polyphagia and polyuria.  Genitourinary: Negative for dysuria, urgency, frequency and hematuria.  Musculoskeletal: Negative for back pain and neck stiffness.  Skin: Negative for rash.  Allergic/Immunologic: Negative for immunocompromised state.  Neurological: Negative for syncope and light-headedness.  Hematological: Does not bruise/bleed easily.  Psychiatric/Behavioral: Negative for sleep disturbance. The patient is not nervous/anxious.       Allergies  Review of patient's allergies indicates no known allergies.  Home Medications   Prior to Admission medications   Medication Sig Start Date End Date Taking? Authorizing Provider  ibuprofen (ADVIL,MOTRIN) 200  MG tablet Take 200 mg by mouth every 6 (six) hours as needed for moderate pain.   Yes Historical Provider, MD  hydrochlorothiazide (MICROZIDE) 12.5 MG capsule Take 1 capsule (12.5 mg total) by mouth daily. 05/07/15 11/23/16  Maecie Sevcik, PA-C  promethazine-dextromethorphan (PROMETHAZINE-DM) 6.25-15 MG/5ML syrup Take 5 mLs by mouth 4 (four) times daily as needed. Patient not taking: Reported on 05/07/2015 09/25/13   Midge Minium, MD    BP 142/80 mmHg  Pulse 68  Temp(Src) 98.3 F (36.8 C) (Oral)  Resp 18  SpO2 99% Physical Exam  Constitutional: She is oriented to person, place, and time. She appears well-developed and well-nourished. No distress.  HENT:  Head: Normocephalic and atraumatic.  Mouth/Throat: Oropharynx is clear and moist.  Eyes: Conjunctivae and EOM are normal. Pupils are equal, round, and reactive to light. No scleral icterus.  No horizontal, vertical or rotational nystagmus  Neck: Normal range of motion. Neck supple.  Full active and passive ROM without pain No midline or paraspinal tenderness No nuchal rigidity or meningeal signs  Cardiovascular: Normal rate, regular rhythm, normal heart sounds and intact distal pulses.   No murmur heard. Pulmonary/Chest: Effort normal and breath sounds normal. No respiratory distress. She has no wheezes. She has no rales.  Abdominal: Soft. Bowel sounds are normal. There is no tenderness. There is no rebound and no guarding.  Soft and nontender  Musculoskeletal: Normal range of motion.  Lymphadenopathy:    She has no cervical adenopathy.  Neurological: She is alert and oriented to person, place, and time. She has normal reflexes. No cranial nerve deficit. She exhibits normal muscle tone. Coordination normal.  Mental Status:  Alert, oriented, thought content appropriate. Speech fluent without evidence of aphasia. Able to follow 2 step commands without difficulty.  Cranial Nerves:  II:  Peripheral visual fields grossly normal, pupils equal, round, reactive to light III,IV, VI: ptosis not present, extra-ocular motions intact bilaterally  V,VII: smile symmetric, facial light touch sensation equal VIII: hearing grossly normal bilaterally  IX,X: midline uvula rise  XI: bilateral shoulder shrug equal and strong XII: midline tongue extension  Motor:  5/5 in upper and lower extremities bilaterally including strong and equal grip strength and dorsiflexion/plantar  flexion Sensory: Pinprick and light touch normal in all extremities.  Deep Tendon Reflexes: 2+ and symmetric  Cerebellar: normal finger-to-nose with bilateral upper extremities Gait: normal gait and balance CV: distal pulses palpable throughout   Skin: Skin is warm and dry. No rash noted. She is not diaphoretic.  Psychiatric: She has a normal mood and affect. Her behavior is normal. Judgment and thought content normal.  Nursing note and vitals reviewed.   ED Course  Procedures (including critical care time) Labs Review Labs Reviewed  POC URINE PREG, ED    Imaging Review No results found.   EKG Interpretation None      MDM   Final diagnoses:  Essential hypertension  Lightheadedness   Peggy Duncan presents with c/o HTN and lightheadedness.  Normal neurologic exam.  BP 156/78 here in the ED.  No signs of hypertensive urgency.  Will check pregnancy test.  Pt is well appearing at this time; not lightheaded.    Orthostatic VS for the past 24 hrs:  BP- Lying Pulse- Lying BP- Sitting Pulse- Sitting BP- Standing at 0 minutes Pulse- Standing at 0 minutes  05/07/15 1557 156/82 mmHg 82 (!) 162/92 mmHg 87 (!) 160/104 mmHg 89   Patient without neurologic deficit and no evidence of hypertensive urgency.  Pregnancy test negative. Recommend that patient begin taking her HCTZ daily and follow-up with her primary care physician within 2 days. She ambulates with steady gait here in the emergency department and has not had a return of her symptoms since her blood pressure decreased spontaneously.  No concern for subarachnoid hemorrhage or increased intracranial pressure.    Peggy Soho Shuntavia Yerby, PA-C 05/07/15 [place], MD 05/08/15 1336

## 2015-05-07 NOTE — ED Notes (Signed)
Pt reports recent elevated BP and headache. Ongoing intermittently for several days, taking HCTZ as needed. Pt is alert and oriented x4. NAD

## 2015-05-14 ENCOUNTER — Telehealth: Payer: Self-pay | Admitting: Family Medicine

## 2015-05-14 NOTE — Telephone Encounter (Signed)
Pt called in, ER follow up needed, went to ER last Tuesday for elevated BP. They gave her meds and she is still feeling off and BP still slightly elevated. Last BP 172/96 per pt.

## 2015-05-15 ENCOUNTER — Ambulatory Visit (INDEPENDENT_AMBULATORY_CARE_PROVIDER_SITE_OTHER): Payer: BLUE CROSS/BLUE SHIELD | Admitting: Family Medicine

## 2015-05-15 ENCOUNTER — Encounter: Payer: Self-pay | Admitting: Family Medicine

## 2015-05-15 VITALS — BP 168/92 | HR 110 | Temp 98.0°F | Resp 16 | Wt 159.0 lb

## 2015-05-15 DIAGNOSIS — N951 Menopausal and female climacteric states: Secondary | ICD-10-CM | POA: Diagnosis not present

## 2015-05-15 DIAGNOSIS — I10 Essential (primary) hypertension: Secondary | ICD-10-CM | POA: Diagnosis not present

## 2015-05-15 MED ORDER — LOSARTAN POTASSIUM 50 MG PO TABS: 50.0000 mg | ORAL_TABLET | Freq: Every day | ORAL | Status: DC

## 2015-05-15 NOTE — Telephone Encounter (Signed)
Called patient to follow-up and left message at [phone] (Work) *Preferred* to return call.

## 2015-05-15 NOTE — Progress Notes (Signed)
Pre visit review using our clinic review tool, if applicable. No additional management support is needed unless otherwise documented below in the visit note. 

## 2015-05-15 NOTE — Progress Notes (Signed)
   Subjective:    Patient ID: Danica Camarena, female    DOB: [birth year], 49 y.o.   MRN: [id number]  HPI HTN- chronic problem, pt has not been seen in over 2 yrs.  Went to ER on 8/9 w/ lightheadedness, HA and BP was found to be quite elevated.  Pt was only taking HCTZ a few days/week and medication was out of date.  Pt states she is now taking medication daily but BP is not controlled.  Pt reports BP is labile.  Since starting meds pt has had increased dizziness, 'odd sensation of floating'.  Pt admits to 'terrible' eating.  Pt states she has made dietary changes, increased water intake.  Not getting good sleep.  Exercising regularly.  Pt reports new onset hot flashes- had period last month, no period yet this month (had negative Upreg in ER).  No CP, SOB.  Continues to have throbbing in temples.  + family hx of HTN   Review of Systems For ROS see HPI     Objective:   Physical Exam  Constitutional: She is oriented to person, place, and time. She appears well-developed and well-nourished. No distress.  HENT:  Head: Normocephalic and atraumatic.  Eyes: Conjunctivae and EOM are normal. Pupils are equal, round, and reactive to light.  Neck: Normal range of motion. Neck supple. No thyromegaly present.  Cardiovascular: Normal rate, regular rhythm, normal heart sounds and intact distal pulses.   No murmur heard. Pulmonary/Chest: Effort normal and breath sounds normal. No respiratory distress.  Abdominal: Soft. She exhibits no distension. There is no tenderness.  Musculoskeletal: She exhibits no edema.  Lymphadenopathy:    She has no cervical adenopathy.  Neurological: She is alert and oriented to person, place, and time.  Skin: Skin is warm and dry.  Psychiatric: She has a normal mood and affect. Her behavior is normal.  Vitals reviewed.         Assessment & Plan:

## 2015-05-15 NOTE — Patient Instructions (Signed)
Follow up in 3-4 weeks We'll notify you of your lab results and make any changes if needed Start the Losartan daily STOP the HCTZ For the hot flashes- start OTC Black Cohosh or Estroven Drink plenty of water Low salt, low carb diet will improve both BP and hot flashes Keep up the good work on the exercise!! Call with any questions or concerns Hang in there!!  We're going to get this right!

## 2015-05-15 NOTE — Assessment & Plan Note (Signed)
New.  Likely worsened by pt's uncontrolled BP.  Reviewed dietary and lifestyle modifications to improve sxs.  Will start OTC Black Cohosh or estroven for symptomatic relief.  Pt expressed understanding and is in agreement w/ plan.

## 2015-05-15 NOTE — Assessment & Plan Note (Addendum)
Deteriorated.  Pt's BP has not improved since starting HCTZ daily on 8/9 after ER visit.  Pt also reports side effects from medication.  Will stop HCTZ and start Losartan 50mg - w/ the understanding that we will likely need to increase this dose.  Check labs.  Will continue to follow closely.  If BP doesn't improve w/ medication, will need renal US to r/o RAS.

## 2015-05-16 ENCOUNTER — Encounter: Payer: Self-pay | Admitting: General Practice

## 2015-05-16 LAB — CBC WITH DIFFERENTIAL/PLATELET
Basophils Absolute: 0 10*3/uL (ref 0.0–0.1)
Basophils Relative: 0.4 % (ref 0.0–3.0)
EOS PCT: 0.8 % (ref 0.0–5.0)
Eosinophils Absolute: 0 10*3/uL (ref 0.0–0.7)
HCT: 40.1 % (ref 36.0–46.0)
HEMOGLOBIN: 13.9 g/dL (ref 12.0–15.0)
Lymphocytes Relative: 46 % (ref 12.0–46.0)
Lymphs Abs: 2.8 10*3/uL (ref 0.7–4.0)
MCHC: 34.5 g/dL (ref 30.0–36.0)
MCV: 79.6 fl (ref 78.0–100.0)
MONO ABS: 0.3 10*3/uL (ref 0.1–1.0)
MONOS PCT: 5.3 % (ref 3.0–12.0)
Neutro Abs: 2.9 10*3/uL (ref 1.4–7.7)
Neutrophils Relative %: 47.5 % (ref 43.0–77.0)
Platelets: 336 10*3/uL (ref 150.0–400.0)
RBC: 5.04 Mil/uL (ref 3.87–5.11)
RDW: 14.4 % (ref 11.5–15.5)
WBC: 6 10*3/uL (ref 4.0–10.5)

## 2015-05-16 LAB — BASIC METABOLIC PANEL
BUN: 17 mg/dL (ref 6–23)
CHLORIDE: 99 meq/L (ref 96–112)
CO2: 29 mEq/L (ref 19–32)
Calcium: 10 mg/dL (ref 8.4–10.5)
Creatinine, Ser: 1.01 mg/dL (ref 0.40–1.20)
GFR: 74.58 mL/min (ref >60.00)
Glucose, Bld: 101 mg/dL — ABNORMAL HIGH (ref 70–99)
POTASSIUM: 3.3 meq/L — AB (ref 3.5–5.1)
Sodium: 137 mEq/L (ref 135–145)

## 2015-05-16 LAB — TSH: TSH: 0.81 u[IU]/mL (ref 0.35–4.50)

## 2015-05-16 NOTE — Telephone Encounter (Signed)
No charge. 

## 2015-05-16 NOTE — Telephone Encounter (Signed)
Pt was no show 05/07/15 1:00pm, per history and notes pt went to ER, I'm assuming no charge for no show?

## 2015-05-16 NOTE — Telephone Encounter (Signed)
Seen by Dr. Birdie Riddle 05/15/15.

## 2015-06-06 ENCOUNTER — Encounter: Payer: Self-pay | Admitting: Family Medicine

## 2015-06-06 ENCOUNTER — Ambulatory Visit (INDEPENDENT_AMBULATORY_CARE_PROVIDER_SITE_OTHER): Payer: BLUE CROSS/BLUE SHIELD | Admitting: Family Medicine

## 2015-06-06 VITALS — BP 140/80 | HR 104 | Temp 98.1°F | Resp 16 | Ht 64.0 in | Wt 161.1 lb

## 2015-06-06 DIAGNOSIS — I10 Essential (primary) hypertension: Secondary | ICD-10-CM

## 2015-06-06 NOTE — Progress Notes (Signed)
   Subjective:    Patient ID: Peggy Duncan, female    DOB: 1966/05/31, 49 y.o.   MRN: [id number]  HPI HTN- ongoing issue.  MUCH improved since starting Losartan at last visit.  Pt reports feeling 'a lot better'.  HAs have improved.  No CP, SOB, visual changes, edema.  Not having side effects w/ Losartan like she was w/ HCTZ.  Has decreased salt intake.   Review of Systems For ROS see HPI     Objective:   Physical Exam  Constitutional: She is oriented to person, place, and time. She appears well-developed and well-nourished. No distress.  HENT:  Head: Normocephalic and atraumatic.  Eyes: Conjunctivae and EOM are normal. Pupils are equal, round, and reactive to light.  Neck: Normal range of motion. Neck supple. No thyromegaly present.  Cardiovascular: Normal rate, regular rhythm, normal heart sounds and intact distal pulses.   No murmur heard. Pulmonary/Chest: Effort normal and breath sounds normal. No respiratory distress.  Abdominal: Soft. She exhibits no distension. There is no tenderness.  Musculoskeletal: She exhibits no edema.  Lymphadenopathy:    She has no cervical adenopathy.  Neurological: She is alert and oriented to person, place, and time.  Skin: Skin is warm and dry.  Psychiatric: She has a normal mood and affect. Her behavior is normal.  Vitals reviewed.         Assessment & Plan:

## 2015-06-06 NOTE — Patient Instructions (Signed)
Schedule your complete physical in 6 months We'll notify you of your lab results and make any changes if needed Keep up the good work on healthy diet and regular exercise- you look great! Call with any questions or concerns Have a great fall season!!!

## 2015-06-06 NOTE — Assessment & Plan Note (Signed)
Much improved since starting Losartan.  Asymptomatic and no medication side effects.  Pt is working hard on lifestyle changes- limiting salt, increasing water.  Check BMP.  No anticipated med changes.

## 2015-06-06 NOTE — Progress Notes (Signed)
Pre visit review using our clinic review tool, if applicable. No additional management support is needed unless otherwise documented below in the visit note. 

## 2015-06-28 ENCOUNTER — Other Ambulatory Visit: Payer: Self-pay | Admitting: General Practice

## 2015-06-28 MED ORDER — LOSARTAN POTASSIUM 50 MG PO TABS: 50.0000 mg | ORAL_TABLET | Freq: Every day | ORAL | Status: DC

## 2015-10-30 ENCOUNTER — Telehealth: Payer: Self-pay | Admitting: Family Medicine

## 2015-10-30 NOTE — Telephone Encounter (Signed)
Chart updated to reflect information

## 2015-10-30 NOTE — Telephone Encounter (Signed)
Pt states she had her flu shot a few weeks ago at work then hung up.

## 2015-12-05 ENCOUNTER — Encounter: Payer: Self-pay | Admitting: Family Medicine

## 2015-12-05 ENCOUNTER — Ambulatory Visit (INDEPENDENT_AMBULATORY_CARE_PROVIDER_SITE_OTHER): Payer: BLUE CROSS/BLUE SHIELD | Admitting: Family Medicine

## 2015-12-05 ENCOUNTER — Other Ambulatory Visit: Payer: Self-pay | Admitting: Family Medicine

## 2015-12-05 ENCOUNTER — Other Ambulatory Visit: Payer: Self-pay | Admitting: General Practice

## 2015-12-05 VITALS — BP 138/82 | HR 84 | Temp 98.0°F | Resp 16 | Ht 64.0 in | Wt 163.5 lb

## 2015-12-05 DIAGNOSIS — Z0000 Encounter for general adult medical examination without abnormal findings: Secondary | ICD-10-CM

## 2015-12-05 DIAGNOSIS — Z23 Encounter for immunization: Secondary | ICD-10-CM | POA: Diagnosis not present

## 2015-12-05 DIAGNOSIS — Z1231 Encounter for screening mammogram for malignant neoplasm of breast: Secondary | ICD-10-CM | POA: Diagnosis not present

## 2015-12-05 DIAGNOSIS — Z1211 Encounter for screening for malignant neoplasm of colon: Secondary | ICD-10-CM

## 2015-12-05 LAB — HEPATIC FUNCTION PANEL
ALT: 18 U/L (ref 0–35)
AST: 23 U/L (ref 0–37)
Albumin: 4.4 g/dL (ref 3.5–5.2)
Alkaline Phosphatase: 66 U/L (ref 39–117)
BILIRUBIN DIRECT: 0.1 mg/dL (ref 0.0–0.3)
BILIRUBIN TOTAL: 0.6 mg/dL (ref 0.2–1.2)
TOTAL PROTEIN: 8 g/dL (ref 6.0–8.3)

## 2015-12-05 LAB — CBC WITH DIFFERENTIAL/PLATELET
Basophils Absolute: 0 10*3/uL (ref 0.0–0.1)
Basophils Relative: 0.4 % (ref 0.0–3.0)
EOS PCT: 0.9 % (ref 0.0–5.0)
Eosinophils Absolute: 0.1 10*3/uL (ref 0.0–0.7)
HCT: 38.2 % (ref 36.0–46.0)
Hemoglobin: 12.8 g/dL (ref 12.0–15.0)
LYMPHS ABS: 2.5 10*3/uL (ref 0.7–4.0)
Lymphocytes Relative: 44.5 % (ref 12.0–46.0)
MCHC: 33.5 g/dL (ref 30.0–36.0)
MCV: 82 fl (ref 78.0–100.0)
MONOS PCT: 4.8 % (ref 3.0–12.0)
Monocytes Absolute: 0.3 10*3/uL (ref 0.1–1.0)
NEUTROS ABS: 2.8 10*3/uL (ref 1.4–7.7)
NEUTROS PCT: 49.4 % (ref 43.0–77.0)
PLATELETS: 360 10*3/uL (ref 150.0–400.0)
RBC: 4.66 Mil/uL (ref 3.87–5.11)
RDW: 14 % (ref 11.5–15.5)
WBC: 5.7 10*3/uL (ref 4.0–10.5)

## 2015-12-05 LAB — BASIC METABOLIC PANEL
BUN: 11 mg/dL (ref 6–23)
CALCIUM: 9.7 mg/dL (ref 8.4–10.5)
CO2: 29 meq/L (ref 19–32)
CREATININE: 0.75 mg/dL (ref 0.40–1.20)
Chloride: 104 mEq/L (ref 96–112)
GFR: 104.91 mL/min (ref >60.00)
GLUCOSE: 86 mg/dL (ref 70–99)
Potassium: 3.6 mEq/L (ref 3.5–5.1)
SODIUM: 140 meq/L (ref 135–145)

## 2015-12-05 LAB — VITAMIN D 25 HYDROXY (VIT D DEFICIENCY, FRACTURES): VITD: 12.47 ng/mL — ABNORMAL LOW (ref 30.00–100.00)

## 2015-12-05 LAB — LIPID PANEL
CHOLESTEROL: 202 mg/dL — AB (ref 0–200)
HDL: 56.2 mg/dL (ref >39.00)
LDL Cholesterol: 125 mg/dL — ABNORMAL HIGH (ref 0–99)
NONHDL: 145.64
Total CHOL/HDL Ratio: 4
Triglycerides: 104 mg/dL (ref 0.0–149.0)
VLDL: 20.8 mg/dL (ref 0.0–40.0)

## 2015-12-05 LAB — TSH: TSH: 0.68 u[IU]/mL (ref 0.35–4.50)

## 2015-12-05 MED ORDER — VITAMIN D (ERGOCALCIFEROL) 1.25 MG (50000 UNIT) PO CAPS: 50000.0000 [IU] | ORAL_CAPSULE | ORAL | Status: DC

## 2015-12-05 NOTE — Assessment & Plan Note (Signed)
Pt's PE WNL.  UTD on pap but due for mammo and colonoscopy.  Referral placed for colonoscopy, order for mammo entered.  Tdap updated.  Check labs.  Anticipatory guidance provided.

## 2015-12-05 NOTE — Progress Notes (Signed)
   Subjective:    Patient ID: Peggy Duncan, female    DOB: 1966/01/26, 50 y.o.   MRN: [id number]  HPI PE- pt is UTD on pap, but due for mammo ([facility]) and now colonoscopy ([facility]).  Due for Tdap.     Review of Systems Patient reports no vision/ hearing changes, adenopathy,fever, weight change,  persistant/recurrent hoarseness , swallowing issues, chest pain, palpitations, edema, persistant/recurrent cough, hemoptysis, dyspnea (rest/exertional/paroxysmal nocturnal), gastrointestinal bleeding (melena, rectal bleeding), abdominal pain, significant heartburn, bowel changes, GU symptoms (dysuria, hematuria, incontinence), Gyn symptoms (abnormal  bleeding, pain),  syncope, focal weakness, memory loss, numbness & tingling, skin/hair/nail changes, abnormal bruising or bleeding, anxiety, or depression.     Objective:   Physical Exam General Appearance:    Alert, cooperative, no distress, appears stated age  Head:    Normocephalic, without obvious abnormality, atraumatic  Eyes:    PERRL, conjunctiva/corneas clear, EOM's intact, fundi    benign, both eyes  Ears:    Normal TM's and external ear canals, both ears  Nose:   Nares normal, septum midline, mucosa normal, no drainage    or sinus tenderness  Throat:   Lips, mucosa, and tongue normal; teeth and gums normal  Neck:   Supple, symmetrical, trachea midline, no adenopathy;    Thyroid: no enlargement/tenderness/nodules  Back:     Symmetric, no curvature, ROM normal, no CVA tenderness  Lungs:     Clear to auscultation bilaterally, respirations unlabored  Chest Wall:    No tenderness or deformity   Heart:    Regular rate and rhythm, S1 and S2 normal, no murmur, rub   or gallop  Breast Exam:    Deferred to GYN  Abdomen:     Soft, non-tender, bowel sounds active all four quadrants,    no masses, no organomegaly  Genitalia:    Deferred to GYN  Rectal:    Extremities:   Extremities normal, atraumatic, no cyanosis or edema  Pulses:   2+ and  symmetric all extremities  Skin:   Skin color, texture, turgor normal, no rashes or lesions  Lymph nodes:   Cervical, supraclavicular, and axillary nodes normal  Neurologic:   CNII-XII intact, normal strength, sensation and reflexes    throughout          Assessment & Plan:

## 2015-12-05 NOTE — Telephone Encounter (Signed)
Medication filled to pharmacy as requested.   

## 2015-12-05 NOTE — Progress Notes (Signed)
Pre visit review using our clinic review tool, if applicable. No additional management support is needed unless otherwise documented below in the visit note. 

## 2015-12-05 NOTE — Patient Instructions (Signed)
Follow up in 6 months to recheck BP We'll notify you of your lab results and make any changes if needed Keep up the good work on healthy diet and regular exercise- you look great! We'll call you with your mammogram appt and your GI consultation Call with any questions or concerns If you want to join [place] at the new [place] office, any scheduled appointments will automatically transfer and we will see you at [address] [place] [address], [place], [place] [address] ([place]) Happy [place]!!!

## 2015-12-05 NOTE — Addendum Note (Signed)
Addended by: Davis Gourd on: 12/05/2015 09:43 AM   Modules accepted: Orders

## 2015-12-23 ENCOUNTER — Telehealth: Payer: Self-pay | Admitting: Family Medicine

## 2015-12-23 NOTE — Telephone Encounter (Signed)
Pt states that she is having issues with her Vit D. She states that she is having headaches and feeling dizzy. Pt asking what she should do. Please advise

## 2015-12-23 NOTE — Telephone Encounter (Signed)
Called pt and LMOVM to return call.  °

## 2015-12-24 NOTE — Telephone Encounter (Signed)
Spoke with pt and she informed that the dizziness is worse upon bending over and she feels like the room spins. Pt was scheduled for tomorrow at 2:45.

## 2015-12-25 ENCOUNTER — Ambulatory Visit (INDEPENDENT_AMBULATORY_CARE_PROVIDER_SITE_OTHER): Payer: BLUE CROSS/BLUE SHIELD | Admitting: Family Medicine

## 2015-12-25 ENCOUNTER — Encounter: Payer: Self-pay | Admitting: Family Medicine

## 2015-12-25 VITALS — BP 146/88 | HR 91 | Temp 98.0°F | Resp 16 | Ht 64.0 in | Wt 163.2 lb

## 2015-12-25 DIAGNOSIS — R42 Dizziness and giddiness: Secondary | ICD-10-CM | POA: Diagnosis not present

## 2015-12-25 LAB — BASIC METABOLIC PANEL
BUN: 10 mg/dL (ref 7–25)
CALCIUM: 9.8 mg/dL (ref 8.6–10.4)
CO2: 29 mmol/L (ref 20–31)
CREATININE: 0.71 mg/dL (ref 0.50–1.05)
Chloride: 102 mmol/L (ref 98–110)
Glucose, Bld: 103 mg/dL — ABNORMAL HIGH (ref 65–99)
Potassium: 4.1 mmol/L (ref 3.5–5.3)
Sodium: 140 mmol/L (ref 135–146)

## 2015-12-25 MED ORDER — MECLIZINE HCL 25 MG PO TABS: 25.0000 mg | ORAL_TABLET | Freq: Three times a day (TID) | ORAL | Status: DC | PRN

## 2015-12-25 NOTE — Patient Instructions (Signed)
Follow up as needed Continue to drink plenty of fluids Change positions slowly to allow yourself time to adjust Use the Meclizine as needed for dizziness Do the exercises twice daily- when you wake up and prior to going to bed Start daily Claritin or Zyrtec to help w/ the allergy component Call with any questions or concerns Hang in there!!

## 2015-12-25 NOTE — Progress Notes (Signed)
   Subjective:    Patient ID: Peggy Duncan, female    DOB: [birth year], 50 y.o.   MRN: [id number]  HPI Dizziness- pt reports sxs started after starting Vit D.  Initially started as feeling 'out of sorts' and 'wanting to lie down'.  Occuring at different times throughout the day, worse w/ climbing stairs or bending down and standing up.  Pt now having sxs when driving up and down hills.  Will have occasional spinning but more often just feels 'off balance'.  sxs do resolve spontaneously.  Pt was able to work out last night w/o difficulty but had episode when she got to her car.  Pt is drinking 40-50 oz of water daily.   Review of Systems For ROS see HPI     Objective:   Physical Exam  Constitutional: She is oriented to person, place, and time. She appears well-developed and well-nourished. No distress.  HENT:  Head: Normocephalic and atraumatic.  Mouth/Throat: Uvula is midline and mucous membranes are normal.  TMs WNL No TTP over sinuses Minimal nasal congestion  Eyes: Conjunctivae and EOM are normal. Pupils are equal, round, and reactive to light.  2-3 beats of horizontal nystagmus  Neck: Normal range of motion. Neck supple.  Cardiovascular: Normal rate, regular rhythm, normal heart sounds and intact distal pulses.   Pulmonary/Chest: Effort normal and breath sounds normal. No respiratory distress. She has no wheezes. She has no rales.  Musculoskeletal: She exhibits no edema.  Lymphadenopathy:    She has no cervical adenopathy.  Neurological: She is alert and oriented to person, place, and time. She has normal reflexes. No cranial nerve deficit.  Skin: Skin is warm and dry.  Psychiatric: She has a normal mood and affect. Her behavior is normal. Judgment and thought content normal.  Vitals reviewed.         Assessment & Plan:

## 2015-12-25 NOTE — Progress Notes (Signed)
Pre visit review using our clinic review tool, if applicable. No additional management support is needed unless otherwise documented below in the visit note. 

## 2015-12-26 ENCOUNTER — Telehealth: Payer: Self-pay | Admitting: Family Medicine

## 2015-12-26 NOTE — Assessment & Plan Note (Signed)
Recurrent problem for pt.  Suspect that this is multifactorial- mild orthostatic hypotension, mild vertigo due to current allergy congestion.  PE WNL- no red flags.  Since pt feels sxs are related to Vit D will hold 50,000 unit weekly dose and monitor for improvement.  Recommended daily Zyrtec to decrease allergy congestion.  Meclizine prn.  Encouraged increased water intake and changing positions slowly.  Check labs to r/o hypercalcemia as this is pt's fear.  Reviewed supportive care and red flags that should prompt return.  Pt expressed understanding and is in agreement w/ plan.

## 2015-12-26 NOTE — Telephone Encounter (Signed)
Pt thinks that maybe her BP meds may be her problem and asking if she could take it at night or try something new.

## 2015-12-27 NOTE — Telephone Encounter (Signed)
Patient aware per Dr Birdie Riddle ok to move the meds to night time, she will move to night time and see if it improves dizziness.

## 2015-12-27 NOTE — Telephone Encounter (Signed)
She can certainly move meds to night and see if dizziness improves

## 2015-12-30 NOTE — Telephone Encounter (Signed)
Since she has not noticed a change, I would have her stop the Losartan and start Valsartan 40mg  daily and return for repeat BP visit in 1 month.  Hopefully switching the medication will improve her dizziness

## 2015-12-30 NOTE — Telephone Encounter (Signed)
Called pt and LMOVM to return call.  °

## 2015-12-30 NOTE — Telephone Encounter (Signed)
Pt now states that there was no change in moving BP meds to night time and asking what should she do.

## 2015-12-31 NOTE — Telephone Encounter (Signed)
Peggy Duncan and LMOVM

## 2016-01-01 MED ORDER — VALSARTAN 40 MG PO TABS: 40.0000 mg | ORAL_TABLET | Freq: Every day | ORAL | Status: DC

## 2016-01-01 NOTE — Telephone Encounter (Signed)
Read the patienet what Dr Birdie Riddle wants her to do about her bp med.  Patient voiced understanding.  She wanted to be sure you called the med in to her pharmacy in [place]

## 2016-01-01 NOTE — Telephone Encounter (Signed)
Med filled and chart updated to reflect.  

## 2016-03-04 ENCOUNTER — Other Ambulatory Visit: Payer: Self-pay | Admitting: Family Medicine

## 2016-03-04 NOTE — Telephone Encounter (Signed)
Medication filled to pharmacy as requested.   

## 2016-03-13 ENCOUNTER — Other Ambulatory Visit: Payer: Self-pay | Admitting: General Practice

## 2016-03-13 MED ORDER — VALSARTAN 40 MG PO TABS: 40.0000 mg | ORAL_TABLET | Freq: Every day | ORAL | Status: DC

## 2016-04-28 ENCOUNTER — Telehealth: Payer: Self-pay | Admitting: Family Medicine

## 2016-04-28 NOTE — Telephone Encounter (Signed)
Pt states the she is having an irregular period with on and off again bleeding and asking what should she do. Pt is aware that KT is out of office until weds.

## 2016-04-29 NOTE — Telephone Encounter (Signed)
Pt will call and see if she can be seen by Dr.O'keefe

## 2016-04-29 NOTE — Telephone Encounter (Signed)
Pt has GYN Dr. Doreene Nest, she should be seen there correct?

## 2016-04-29 NOTE — Telephone Encounter (Signed)
If pt is still seeing GYN, this is a question for them to address

## 2016-05-28 LAB — HM PAP SMEAR

## 2016-06-08 LAB — HM MAMMOGRAPHY: HM MAMMO: NORMAL (ref 0–4)

## 2016-06-09 ENCOUNTER — Ambulatory Visit: Payer: BLUE CROSS/BLUE SHIELD | Admitting: Family Medicine

## 2016-06-25 ENCOUNTER — Ambulatory Visit: Payer: BLUE CROSS/BLUE SHIELD | Admitting: Family Medicine

## 2016-07-07 ENCOUNTER — Ambulatory Visit (INDEPENDENT_AMBULATORY_CARE_PROVIDER_SITE_OTHER): Payer: BLUE CROSS/BLUE SHIELD | Admitting: Family Medicine

## 2016-07-07 ENCOUNTER — Encounter: Payer: Self-pay | Admitting: Family Medicine

## 2016-07-07 VITALS — BP 122/70 | HR 75 | Temp 98.1°F | Resp 16 | Ht 64.0 in | Wt 171.5 lb

## 2016-07-07 DIAGNOSIS — I10 Essential (primary) hypertension: Secondary | ICD-10-CM

## 2016-07-07 LAB — CBC WITH DIFFERENTIAL/PLATELET
BASOS PCT: 0.6 % (ref 0.0–3.0)
Basophils Absolute: 0 10*3/uL (ref 0.0–0.1)
EOS PCT: 1.2 % (ref 0.0–5.0)
Eosinophils Absolute: 0.1 10*3/uL (ref 0.0–0.7)
HCT: 37.7 % (ref 36.0–46.0)
HEMOGLOBIN: 12.6 g/dL (ref 12.0–15.0)
Lymphocytes Relative: 41.1 % (ref 12.0–46.0)
Lymphs Abs: 2.4 10*3/uL (ref 0.7–4.0)
MCHC: 33.4 g/dL (ref 30.0–36.0)
MCV: 80.6 fl (ref 78.0–100.0)
MONO ABS: 0.3 10*3/uL (ref 0.1–1.0)
Monocytes Relative: 4.7 % (ref 3.0–12.0)
NEUTROS ABS: 3.1 10*3/uL (ref 1.4–7.7)
Neutrophils Relative %: 52.4 % (ref 43.0–77.0)
PLATELETS: 347 10*3/uL (ref 150.0–400.0)
RBC: 4.68 Mil/uL (ref 3.87–5.11)
RDW: 14.9 % (ref 11.5–15.5)
WBC: 5.9 10*3/uL (ref 4.0–10.5)

## 2016-07-07 LAB — BASIC METABOLIC PANEL
BUN: 10 mg/dL (ref 6–23)
CHLORIDE: 103 meq/L (ref 96–112)
CO2: 31 meq/L (ref 19–32)
CREATININE: 0.82 mg/dL (ref 0.40–1.20)
Calcium: 9.7 mg/dL (ref 8.4–10.5)
GFR: 94.42 mL/min (ref >60.00)
GLUCOSE: 98 mg/dL (ref 70–99)
POTASSIUM: 4.1 meq/L (ref 3.5–5.1)
Sodium: 140 mEq/L (ref 135–145)

## 2016-07-07 NOTE — Progress Notes (Signed)
Pre visit review using our clinic review tool, if applicable. No additional management support is needed unless otherwise documented below in the visit note. 

## 2016-07-07 NOTE — Progress Notes (Signed)
   Subjective:    Patient ID: Peggy Duncan, female    DOB: [birth year], 50 y.o.   MRN: [id number]  HPI HTN- chronic problem, on Diovan daily.  Pt has gained 8 lbs since last visit.  No CP, SOB, HAs, visual changes, edema.  Exercising regularly- pt goes to gym 3-4x/week.   Review of Systems For ROS see HPI     Objective:   Physical Exam  Constitutional: She is oriented to person, place, and time. She appears well-developed and well-nourished. No distress.  HENT:  Head: Normocephalic and atraumatic.  Eyes: Conjunctivae and EOM are normal. Pupils are equal, round, and reactive to light.  Neck: Normal range of motion. Neck supple. No thyromegaly present.  Cardiovascular: Normal rate, regular rhythm, normal heart sounds and intact distal pulses.   No murmur heard. Pulmonary/Chest: Effort normal and breath sounds normal. No respiratory distress.  Abdominal: Soft. She exhibits no distension. There is no tenderness.  Musculoskeletal: She exhibits no edema.  Lymphadenopathy:    She has no cervical adenopathy.  Neurological: She is alert and oriented to person, place, and time.  Skin: Skin is warm and dry.  Psychiatric: She has a normal mood and affect. Her behavior is normal.  Vitals reviewed.         Assessment & Plan:

## 2016-07-07 NOTE — Assessment & Plan Note (Signed)
Chronic problem.  Well controlled since switching to Diovan.  Asymptomatic.  Check labs.  No anticipated med changes.  Will follow.

## 2016-07-07 NOTE — Patient Instructions (Signed)
Schedule your complete physical in 6 months We'll notify you of your lab results and make any changes if needed Keep up the good work on healthy diet and regular exercise- you look great!! Call with any questions or concerns Happy Fall!!! 

## 2016-07-08 ENCOUNTER — Encounter: Payer: Self-pay | Admitting: General Practice

## 2016-08-22 ENCOUNTER — Other Ambulatory Visit: Payer: Self-pay | Admitting: Family Medicine

## 2017-01-11 ENCOUNTER — Encounter: Payer: Self-pay | Admitting: General Practice

## 2017-01-11 ENCOUNTER — Other Ambulatory Visit: Payer: Self-pay | Admitting: General Practice

## 2017-01-11 MED ORDER — VALSARTAN 40 MG PO TABS: 40.0000 mg | ORAL_TABLET | Freq: Every day | ORAL | 0 refills | Status: DC

## 2017-03-25 ENCOUNTER — Ambulatory Visit (INDEPENDENT_AMBULATORY_CARE_PROVIDER_SITE_OTHER): Payer: BLUE CROSS/BLUE SHIELD | Admitting: Family Medicine

## 2017-03-25 ENCOUNTER — Encounter: Payer: Self-pay | Admitting: Family Medicine

## 2017-03-25 VITALS — BP 122/82 | HR 98 | Temp 98.1°F | Resp 16 | Ht 64.0 in | Wt 172.2 lb

## 2017-03-25 DIAGNOSIS — I10 Essential (primary) hypertension: Secondary | ICD-10-CM

## 2017-03-25 LAB — CBC WITH DIFFERENTIAL/PLATELET
BASOS PCT: 0 %
Basophils Absolute: 0 cells/uL (ref 0–200)
EOS PCT: 1 %
Eosinophils Absolute: 68 cells/uL (ref 15–500)
HEMATOCRIT: 35.8 % (ref 35.0–45.0)
Hemoglobin: 12 g/dL (ref 11.7–15.5)
LYMPHS ABS: 3196 {cells}/uL (ref 850–3900)
LYMPHS PCT: 47 %
MCH: 26.4 pg — ABNORMAL LOW (ref 27.0–33.0)
MCHC: 33.5 g/dL (ref 32.0–36.0)
MCV: 78.7 fL — ABNORMAL LOW (ref 80.0–100.0)
MONO ABS: 408 {cells}/uL (ref 200–950)
MPV: 9.6 fL (ref 7.5–12.5)
Monocytes Relative: 6 %
Neutro Abs: 3128 cells/uL (ref 1500–7800)
Neutrophils Relative %: 46 %
Platelets: 397 10*3/uL (ref 140–400)
RBC: 4.55 MIL/uL (ref 3.80–5.10)
RDW: 15.5 % — AB (ref 11.0–15.0)
WBC: 6.8 10*3/uL (ref 3.8–10.8)

## 2017-03-25 NOTE — Patient Instructions (Signed)
Schedule your complete physical in 6 months We'll notify you of your lab results and make any changes if needed Continue to work on healthy diet and regular exercise- you look great! Call with any questions or concerns Have a great summer! Happy Early [place]!!!

## 2017-03-25 NOTE — Assessment & Plan Note (Signed)
Chronic problem.  Well controlled.  Asymptomatic.  Check labs.  No anticipated med changes at this time.  Will follow.

## 2017-03-25 NOTE — Progress Notes (Signed)
Pre visit review using our clinic review tool, if applicable. No additional management support is needed unless otherwise documented below in the visit note. 

## 2017-03-25 NOTE — Progress Notes (Signed)
   Subjective:    Patient ID: Peggy Duncan, female    DOB: [birth year], 51 y.o.   MRN: [id number]  HPI HTN- chronic problem.  BP is well controlled today.  On Valsartan daily.  No CP, SOB, HAs, visual changes, edema.  Exercising 3-4x/week- a lot of weights, some cardio.   Review of Systems For ROS see HPI     Objective:   Physical Exam  Constitutional: She is oriented to person, place, and time. She appears well-developed and well-nourished. No distress.  HENT:  Head: Normocephalic and atraumatic.  Eyes: Conjunctivae and EOM are normal. Pupils are equal, round, and reactive to light.  Neck: Normal range of motion. Neck supple. No thyromegaly present.  Cardiovascular: Normal rate, regular rhythm, normal heart sounds and intact distal pulses.   No murmur heard. Pulmonary/Chest: Effort normal and breath sounds normal. No respiratory distress.  Abdominal: Soft. She exhibits no distension. There is no tenderness.  Musculoskeletal: She exhibits no edema.  Lymphadenopathy:    She has no cervical adenopathy.  Neurological: She is alert and oriented to person, place, and time.  Skin: Skin is warm and dry.  Psychiatric: She has a normal mood and affect. Her behavior is normal.  Vitals reviewed.         Assessment & Plan:

## 2017-03-26 ENCOUNTER — Encounter: Payer: Self-pay | Admitting: General Practice

## 2017-03-26 LAB — BASIC METABOLIC PANEL
BUN: 9 mg/dL (ref 7–25)
CALCIUM: 9.7 mg/dL (ref 8.6–10.4)
CO2: 25 mmol/L (ref 20–31)
Chloride: 104 mmol/L (ref 98–110)
Creat: 0.78 mg/dL (ref 0.50–1.05)
Glucose, Bld: 95 mg/dL (ref 65–99)
Potassium: 4 mmol/L (ref 3.5–5.3)
SODIUM: 138 mmol/L (ref 135–146)

## 2017-04-29 ENCOUNTER — Telehealth: Payer: Self-pay | Admitting: General Practice

## 2017-04-29 NOTE — Telephone Encounter (Signed)
Received a fax informing us that pt is currently on valsartan and would be affected by the recall. Per the pt she would be due for a medication refill on 05/13/17. Please advise on the change in medication.

## 2017-04-30 MED ORDER — LOSARTAN POTASSIUM 25 MG PO TABS: 25.0000 mg | ORAL_TABLET | Freq: Every day | ORAL | 3 refills | Status: DC

## 2017-04-30 NOTE — Telephone Encounter (Signed)
Called pt and informed of recall on valsartan and that Einar Pheasant was changing her medication. New Rx was filled to pharmacy and pt will have to call back to schedule appointment.

## 2017-04-30 NOTE — Addendum Note (Signed)
Addended by: Davis Gourd on: 04/30/2017 10:29 AM   Modules accepted: Orders

## 2017-04-30 NOTE — Telephone Encounter (Signed)
Reviewing in PCP absence. Since she is on Valsartan 40 mg daily, we can switch for losartan 25 mg daily. Ok to send in 30 with 1 refill. Have her come back to office in 2 weeks for repeat assessment of BP with medication change.

## 2017-05-03 ENCOUNTER — Telehealth: Payer: Self-pay | Admitting: Family Medicine

## 2017-05-03 MED ORDER — LOSARTAN POTASSIUM 25 MG PO TABS: 25.0000 mg | ORAL_TABLET | Freq: Every day | ORAL | 1 refills | Status: DC

## 2017-05-03 NOTE — Telephone Encounter (Signed)
Patient is going to call a couple different pharmacies and see if there is a cheaper pharmacy.  She is going to call back to let us know which pharmacy she wants this sent to.

## 2017-05-03 NOTE — Telephone Encounter (Signed)
Patient went to pharmacy to pick up new rx and they are charging her a $45 copay for a 30 day supply. She was wondering if there was any way she could get a copay card, or something cheaper of if she could get a 90 day sent to the mail order pharmacy. Please advise

## 2017-05-03 NOTE — Addendum Note (Signed)
Addended by: Katina Dung on: 05/03/2017 12:27 PM   Modules accepted: Orders

## 2017-05-03 NOTE — Telephone Encounter (Signed)
[facility] [place] [facility].  Patient aware

## 2017-05-25 ENCOUNTER — Ambulatory Visit (INDEPENDENT_AMBULATORY_CARE_PROVIDER_SITE_OTHER): Payer: BLUE CROSS/BLUE SHIELD | Admitting: Family Medicine

## 2017-05-25 ENCOUNTER — Encounter: Payer: Self-pay | Admitting: Family Medicine

## 2017-05-25 VITALS — BP 120/80 | HR 93 | Temp 98.0°F | Resp 16 | Ht 64.0 in | Wt 175.1 lb

## 2017-05-25 DIAGNOSIS — E559 Vitamin D deficiency, unspecified: Secondary | ICD-10-CM | POA: Diagnosis not present

## 2017-05-25 DIAGNOSIS — Z1211 Encounter for screening for malignant neoplasm of colon: Secondary | ICD-10-CM | POA: Diagnosis not present

## 2017-05-25 DIAGNOSIS — Z0000 Encounter for general adult medical examination without abnormal findings: Secondary | ICD-10-CM | POA: Diagnosis not present

## 2017-05-25 DIAGNOSIS — I10 Essential (primary) hypertension: Secondary | ICD-10-CM | POA: Diagnosis not present

## 2017-05-25 LAB — BASIC METABOLIC PANEL
BUN: 10 mg/dL (ref 6–23)
CALCIUM: 9.5 mg/dL (ref 8.4–10.5)
CHLORIDE: 103 meq/L (ref 96–112)
CO2: 31 meq/L (ref 19–32)
CREATININE: 0.74 mg/dL (ref 0.40–1.20)
GFR: 105.93 mL/min (ref >60.00)
Glucose, Bld: 92 mg/dL (ref 70–99)
Potassium: 4 mEq/L (ref 3.5–5.1)
SODIUM: 138 meq/L (ref 135–145)

## 2017-05-25 LAB — CBC WITH DIFFERENTIAL/PLATELET
BASOS PCT: 0.3 % (ref 0.0–3.0)
Basophils Absolute: 0 10*3/uL (ref 0.0–0.1)
EOS ABS: 0 10*3/uL (ref 0.0–0.7)
EOS PCT: 0.7 % (ref 0.0–5.0)
HEMATOCRIT: 36.5 % (ref 36.0–46.0)
Hemoglobin: 12.4 g/dL (ref 12.0–15.0)
LYMPHS PCT: 43.3 % (ref 12.0–46.0)
Lymphs Abs: 2.6 10*3/uL (ref 0.7–4.0)
MCHC: 34.1 g/dL (ref 30.0–36.0)
MCV: 79.1 fl (ref 78.0–100.0)
Monocytes Absolute: 0.3 10*3/uL (ref 0.1–1.0)
Monocytes Relative: 4.8 % (ref 3.0–12.0)
NEUTROS ABS: 3.1 10*3/uL (ref 1.4–7.7)
Neutrophils Relative %: 50.9 % (ref 43.0–77.0)
Platelets: 349 10*3/uL (ref 150.0–400.0)
RBC: 4.62 Mil/uL (ref 3.87–5.11)
RDW: 14.7 % (ref 11.5–15.5)
WBC: 6.1 10*3/uL (ref 4.0–10.5)

## 2017-05-25 LAB — LIPID PANEL
CHOL/HDL RATIO: 5
Cholesterol: 203 mg/dL — ABNORMAL HIGH (ref 0–200)
HDL: 44.9 mg/dL (ref >39.00)
LDL CALC: 128 mg/dL — AB (ref 0–99)
NonHDL: 157.96
TRIGLYCERIDES: 150 mg/dL — AB (ref 0.0–149.0)
VLDL: 30 mg/dL (ref 0.0–40.0)

## 2017-05-25 LAB — HEPATIC FUNCTION PANEL
ALT: 22 U/L (ref 0–35)
AST: 24 U/L (ref 0–37)
Albumin: 3.9 g/dL (ref 3.5–5.2)
Alkaline Phosphatase: 66 U/L (ref 39–117)
BILIRUBIN DIRECT: 0.1 mg/dL (ref 0.0–0.3)
BILIRUBIN TOTAL: 0.5 mg/dL (ref 0.2–1.2)
Total Protein: 7.1 g/dL (ref 6.0–8.3)

## 2017-05-25 LAB — TSH: TSH: 0.79 u[IU]/mL (ref 0.35–4.50)

## 2017-05-25 LAB — VITAMIN D 25 HYDROXY (VIT D DEFICIENCY, FRACTURES): VITD: 10.59 ng/mL — ABNORMAL LOW (ref 30.00–100.00)

## 2017-05-25 NOTE — Progress Notes (Signed)
Pre visit review using our clinic review tool, if applicable. No additional management support is needed unless otherwise documented below in the visit note. 

## 2017-05-25 NOTE — Patient Instructions (Signed)
Follow up in 6 months to recheck BP We'll notify you of your lab results and make any changes if needed Continue to work on healthy diet and regular exercise- you can do it! We'll call you with your GI appt for the colonoscopy consultation Call with any questions or concerns Happy Labor Day!!!

## 2017-05-25 NOTE — Assessment & Plan Note (Signed)
Chronic problem.  Check labs.  Replete prn. 

## 2017-05-25 NOTE — Progress Notes (Signed)
   Subjective:    Patient ID: Peggy Duncan, female    DOB: 30-Aug-1966, 51 y.o.   MRN: [id number]  HPI CPE- UTD on pap, mammo.  Due for colonoscopy.  UTD on Tdap.  Declines flu.   Review of Systems Patient reports no vision/ hearing changes, adenopathy,fever, weight change,  persistant/recurrent hoarseness , swallowing issues, chest pain, palpitations, edema, persistant/recurrent cough, hemoptysis, dyspnea (rest/exertional/paroxysmal nocturnal), gastrointestinal bleeding (melena, rectal bleeding), abdominal pain, significant heartburn, bowel changes, GU symptoms (dysuria, hematuria, incontinence), Gyn symptoms (abnormal  bleeding, pain),  syncope, focal weakness, memory loss, numbness & tingling, skin/hair/nail changes, abnormal bruising or bleeding, anxiety, or depression.     Objective:   Physical Exam General Appearance:    Alert, cooperative, no distress, appears stated age  Head:    Normocephalic, without obvious abnormality, atraumatic  Eyes:    PERRL, conjunctiva/corneas clear, EOM's intact, fundi    benign, both eyes  Ears:    Normal TM's and external ear canals, both ears  Nose:   Nares normal, septum midline, mucosa normal, no drainage    or sinus tenderness  Throat:   Lips, mucosa, and tongue normal; teeth and gums normal  Neck:   Supple, symmetrical, trachea midline, no adenopathy;    Thyroid: no enlargement/tenderness/nodules  Back:     Symmetric, no curvature, ROM normal, no CVA tenderness  Lungs:     Clear to auscultation bilaterally, respirations unlabored  Chest Wall:    No tenderness or deformity   Heart:    Regular rate and rhythm, S1 and S2 normal, no murmur, rub   or gallop  Breast Exam:    Deferred to GYN  Abdomen:     Soft, non-tender, bowel sounds active all four quadrants,    no masses, no organomegaly  Genitalia:    Deferred to GYN  Rectal:    Extremities:   Extremities normal, atraumatic, no cyanosis or edema  Pulses:   2+ and symmetric all extremities    Skin:   Skin color, texture, turgor normal, no rashes or lesions  Lymph nodes:   Cervical, supraclavicular, and axillary nodes normal  Neurologic:   CNII-XII intact, normal strength, sensation and reflexes    throughout          Assessment & Plan:

## 2017-05-25 NOTE — Assessment & Plan Note (Signed)
Chronic problem.  Well controlled today.  Check labs.  No anticipated med changes.  Will follow. 

## 2017-05-25 NOTE — Assessment & Plan Note (Signed)
Pt's PE WNL w/ exception of obesity.  UTD on GYN.  Overdue for colonoscopy- referral placed again.  Check labs.  Anticipatory guidance provided.

## 2017-05-26 ENCOUNTER — Other Ambulatory Visit: Payer: Self-pay | Admitting: General Practice

## 2017-05-26 MED ORDER — LOSARTAN POTASSIUM 25 MG PO TABS: 25.0000 mg | ORAL_TABLET | Freq: Every day | ORAL | 1 refills | Status: DC

## 2017-05-26 MED ORDER — VITAMIN D (ERGOCALCIFEROL) 1.25 MG (50000 UNIT) PO CAPS: 50000.0000 [IU] | ORAL_CAPSULE | ORAL | 0 refills | Status: DC

## 2017-08-23 ENCOUNTER — Other Ambulatory Visit: Payer: Self-pay | Admitting: Family Medicine

## 2017-08-27 ENCOUNTER — Other Ambulatory Visit: Payer: Self-pay | Admitting: Family Medicine

## 2017-09-18 ENCOUNTER — Other Ambulatory Visit: Payer: Self-pay | Admitting: Family Medicine

## 2017-11-22 ENCOUNTER — Other Ambulatory Visit: Payer: Self-pay | Admitting: General Practice

## 2017-11-22 MED ORDER — LOSARTAN POTASSIUM 25 MG PO TABS: 25.0000 mg | ORAL_TABLET | Freq: Every day | ORAL | 0 refills | Status: DC

## 2017-12-03 ENCOUNTER — Other Ambulatory Visit: Payer: Self-pay

## 2017-12-03 ENCOUNTER — Encounter: Payer: Self-pay | Admitting: Family Medicine

## 2017-12-03 ENCOUNTER — Ambulatory Visit: Payer: BLUE CROSS/BLUE SHIELD | Admitting: Family Medicine

## 2017-12-03 VITALS — BP 138/82 | HR 96 | Temp 98.8°F | Resp 17 | Ht 64.0 in | Wt 175.6 lb

## 2017-12-03 DIAGNOSIS — I10 Essential (primary) hypertension: Secondary | ICD-10-CM | POA: Diagnosis not present

## 2017-12-03 NOTE — Progress Notes (Signed)
   Subjective:    Patient ID: Peggy Duncan, female    DOB: November 05, 1965, 52 y.o.   MRN: [id number]  HPI HTN- chronic problem, on Losartan 25mg  daily.  Pt reports home BPs running 120-130/80s.  Pt reports she has had 'a very stressful couple of months'.  No CP, SOB, HAs, visual changes, edema.  Review of Systems For ROS see HPI     Objective:   Physical Exam  Constitutional: She is oriented to person, place, and time. She appears well-developed and well-nourished. No distress.  HENT:  Head: Normocephalic and atraumatic.  Eyes: Conjunctivae and EOM are normal. Pupils are equal, round, and reactive to light.  Neck: Normal range of motion. Neck supple. No thyromegaly present.  Cardiovascular: Normal rate, regular rhythm, normal heart sounds and intact distal pulses.  No murmur heard. Pulmonary/Chest: Effort normal and breath sounds normal. No respiratory distress.  Abdominal: Soft. She exhibits no distension. There is no tenderness.  Musculoskeletal: She exhibits no edema.  Lymphadenopathy:    She has no cervical adenopathy.  Neurological: She is alert and oriented to person, place, and time.  Skin: Skin is warm and dry.  Psychiatric: She has a normal mood and affect. Her behavior is normal.  Vitals reviewed.         Assessment & Plan:

## 2017-12-03 NOTE — Patient Instructions (Signed)
Schedule your complete physical in 6 months We'll notify you of your lab results and make any changes if needed Continue to work on healthy diet and regular exercise- you can do it!! Call with any questions or concerns Have a great weekend!!

## 2017-12-03 NOTE — Assessment & Plan Note (Signed)
Chronic problem.  Adequate control today- pt is under quite a bit of stress.  No med changes at this time.  Check labs.  Will follow at future visits.

## 2017-12-04 LAB — CBC WITH DIFFERENTIAL/PLATELET
BASOS ABS: 21 {cells}/uL (ref 0–200)
Basophils Relative: 0.3 %
EOS ABS: 70 {cells}/uL (ref 15–500)
Eosinophils Relative: 1 %
HCT: 35.4 % (ref 35.0–45.0)
Hemoglobin: 12.1 g/dL (ref 11.7–15.5)
Lymphs Abs: 3031 cells/uL (ref 850–3900)
MCH: 26.1 pg — AB (ref 27.0–33.0)
MCHC: 34.2 g/dL (ref 32.0–36.0)
MCV: 76.5 fL — ABNORMAL LOW (ref 80.0–100.0)
MONOS PCT: 6.7 %
MPV: 10.6 fL (ref 7.5–12.5)
NEUTROS PCT: 48.7 %
Neutro Abs: 3409 cells/uL (ref 1500–7800)
PLATELETS: 354 10*3/uL (ref 140–400)
RBC: 4.63 10*6/uL (ref 3.80–5.10)
RDW: 16.1 % — AB (ref 11.0–15.0)
TOTAL LYMPHOCYTE: 43.3 %
WBC: 7 10*3/uL (ref 3.8–10.8)
WBCMIX: 469 {cells}/uL (ref 200–950)

## 2017-12-04 LAB — BASIC METABOLIC PANEL
BUN: 10 mg/dL (ref 7–25)
CHLORIDE: 103 mmol/L (ref 98–110)
CO2: 27 mmol/L (ref 20–32)
Calcium: 9.5 mg/dL (ref 8.6–10.4)
Creat: 0.83 mg/dL (ref 0.50–1.05)
Glucose, Bld: 83 mg/dL (ref 65–99)
POTASSIUM: 4.1 mmol/L (ref 3.5–5.3)
SODIUM: 141 mmol/L (ref 135–146)

## 2017-12-06 ENCOUNTER — Encounter: Payer: Self-pay | Admitting: General Practice

## 2018-03-11 ENCOUNTER — Other Ambulatory Visit: Payer: Self-pay | Admitting: Family Medicine

## 2018-05-16 ENCOUNTER — Ambulatory Visit: Payer: Self-pay | Admitting: *Deleted

## 2018-05-16 NOTE — Telephone Encounter (Signed)
Pt states that she was doing some liiting and that she thinks she may have strained her back; she said it felt the same way that it did several months ago when she was lifting some weights; the pt says that the pain worsens with twisting or bending,the pt says that she has been taking advil for a couple of days but getting out of the car is hard; recommendations made per nurse triage protocol to include seeing a physician within 3 days; the pt was offered an appointment with Dr Birdie Riddle, [facility], on 8/19 or 05/17/18; the pt states that she will try the  OTC pain medications and heat first; if that does not work she will call back to make an appointment.  Reason for Disposition . [1] MODERATE back pain (e.g., interferes with normal activities) AND [2] present > 3 days  Answer Assessment - Initial Assessment Questions 1. ONSET: "When did the pain begin?"      05/14/18 2. LOCATION: "Where does it hurt?" (upper, mid or lower back)     Lower back on right side (waist area)  3. SEVERITY: "How bad is the pain?"  (e.g., Scale 1-10; mild, moderate, or severe)   - MILD (1-3): doesn't interfere with normal activities    - MODERATE (4-7): interferes with normal activities or awakens from sleep    - SEVERE (8-10): excruciating pain, unable to do any normal activities      moderate 4. PATTERN: "Is the pain constant?" (e.g., yes, no; constant, intermittent)      constant 5. RADIATION: "Does the pain shoot into your legs or elsewhere?"     no 6. CAUSE:  "What do you think is causing the back pain?"      strain 7. BACK OVERUSE:  "Any recent lifting of heavy objects, strenuous work or exercise?"     Yes; help daughter move into college dorm 8. MEDICATIONS: "What have you taken so far for the pain?" (e.g., nothing, acetaminophen, NSAIDS)     Advil; gave relief yesterday, but not as much today 9. NEUROLOGIC SYMPTOMS: "Do you have any weakness, numbness, or problems with bowel/bladder control?"     no 10.  OTHER SYMPTOMS: "Do you have any other symptoms?" (e.g., fever, abdominal pain, burning with urination, blood in urine)       no 11. PREGNANCY: "Is there any chance you are pregnant?" (e.g., yes, no; LMP)       ?, LMP in last 2 months  Protocols used: BACK PAIN-A-AH

## 2018-05-18 ENCOUNTER — Ambulatory Visit: Payer: BLUE CROSS/BLUE SHIELD | Admitting: Family Medicine

## 2018-05-18 ENCOUNTER — Encounter: Payer: Self-pay | Admitting: Family Medicine

## 2018-05-18 ENCOUNTER — Other Ambulatory Visit: Payer: Self-pay

## 2018-05-18 VITALS — BP 132/84 | HR 94 | Temp 98.8°F | Resp 17 | Ht 64.0 in | Wt 171.2 lb

## 2018-05-18 DIAGNOSIS — I10 Essential (primary) hypertension: Secondary | ICD-10-CM | POA: Diagnosis not present

## 2018-05-18 DIAGNOSIS — M545 Low back pain, unspecified: Secondary | ICD-10-CM

## 2018-05-18 MED ORDER — CYCLOBENZAPRINE HCL 10 MG PO TABS: 10.0000 mg | ORAL_TABLET | Freq: Three times a day (TID) | ORAL | 0 refills | Status: DC | PRN

## 2018-05-18 MED ORDER — LOSARTAN POTASSIUM 25 MG PO TABS: 25.0000 mg | ORAL_TABLET | Freq: Every day | ORAL | 0 refills | Status: DC

## 2018-05-18 MED ORDER — MELOXICAM 15 MG PO TABS: 15.0000 mg | ORAL_TABLET | Freq: Every day | ORAL | 0 refills | Status: DC

## 2018-05-18 NOTE — Assessment & Plan Note (Signed)
Deteriorated.  Pt admits she did not take BP meds regularly while moving her daughter into college and ate poorly.  Stressed need for low salt diet and regular use of BP meds.  Pain may also be playing a part.  Pt to return in 1 month for BP recheck.  Pt expressed understanding and is in agreement w/ plan.

## 2018-05-18 NOTE — Progress Notes (Signed)
   Subjective:    Patient ID: Geanine Vandekamp, female    DOB: 12-Feb-1966, 52 y.o.   MRN: [id number]  HPI Back pain- sxs started Saturday.  She has been doing a lot of lifting and packing to move her daughter into college.  Picked up a heavy box out of the car and immediately knew she did it wrong.  Sunday took some Advil, Monday had 'full blown pain'.  Saw RN at work who gave her a heating pad w/ good relief.  'felt 1/2 way decent this AM' when she woke 'but then I started moving around'.  Pain is R sided.  No radiation of pain.  Worse w/ certain movements- 'getting in the car is the worst'.  Has not had any NSAIDs since last night.  No bowel or bladder incontinence, no numbness/tingling.  HTN- chronic problem, on Losartan 25mg  daily.  Was not taking medication regularly over the weekend.  Ate badly.  Denies CP, SOB, HAs, visual changes, edema.   Review of Systems For ROS see HPI     Objective:   Physical Exam  Constitutional: She is oriented to person, place, and time. She appears well-developed and well-nourished. No distress.  HENT:  Head: Normocephalic and atraumatic.  Cardiovascular: Intact distal pulses.  Musculoskeletal: She exhibits no tenderness (no TTP over spine, SI joint, or lumbar paraspinal muscles).  Neurological: She is alert and oriented to person, place, and time. She displays normal reflexes. No cranial nerve deficit. She exhibits normal muscle tone. Coordination normal.  + contralateral SLR on L (-) SLR on R  Skin: Skin is warm and dry.  Psychiatric: She has a normal mood and affect. Her behavior is normal. Thought content normal.  Vitals reviewed.         Assessment & Plan:  R sided back pain- new.  Occurred after lifting heavy box over the weekend.  No TTP over spine but pain w/ certain movements.  Suspect muscle strain w/ intermittent spasm.  Start scheduled NSAIDs.  Add Flexeril prn.  Reviewed supportive care and red flags that should prompt return.  Pt expressed  understanding and is in agreement w/ plan.

## 2018-05-18 NOTE — Patient Instructions (Signed)
Follow up in 1 month to recheck BP START the Meloxicam once daily- take w/ food- for pain and inflammation You can add Tylenol (Acetaminophen) as needed for break through pain but no ibuprofen/aleve/advil/etc HEAT! Use the cyclobenzaprine as needed for spasm and before bed (may cause drowsiness) Call with any questions or concerns Hang in there!!

## 2018-06-12 ENCOUNTER — Other Ambulatory Visit: Payer: Self-pay | Admitting: Family Medicine

## 2018-09-02 ENCOUNTER — Other Ambulatory Visit: Payer: Self-pay | Admitting: Family Medicine

## 2018-10-13 DIAGNOSIS — B3789 Other sites of candidiasis: Secondary | ICD-10-CM | POA: Diagnosis not present

## 2018-10-13 DIAGNOSIS — Z01419 Encounter for gynecological examination (general) (routine) without abnormal findings: Secondary | ICD-10-CM | POA: Diagnosis not present

## 2018-10-13 DIAGNOSIS — Z124 Encounter for screening for malignant neoplasm of cervix: Secondary | ICD-10-CM | POA: Diagnosis not present

## 2018-10-13 DIAGNOSIS — Z1151 Encounter for screening for human papillomavirus (HPV): Secondary | ICD-10-CM | POA: Diagnosis not present

## 2018-10-13 DIAGNOSIS — D219 Benign neoplasm of connective and other soft tissue, unspecified: Secondary | ICD-10-CM | POA: Diagnosis not present

## 2018-10-13 DIAGNOSIS — N852 Hypertrophy of uterus: Secondary | ICD-10-CM | POA: Diagnosis not present

## 2018-10-13 LAB — HM PAP SMEAR

## 2018-10-27 DIAGNOSIS — N852 Hypertrophy of uterus: Secondary | ICD-10-CM | POA: Diagnosis not present

## 2018-10-27 DIAGNOSIS — D251 Intramural leiomyoma of uterus: Secondary | ICD-10-CM | POA: Insufficient documentation

## 2018-10-27 DIAGNOSIS — D250 Submucous leiomyoma of uterus: Secondary | ICD-10-CM | POA: Diagnosis not present

## 2018-11-27 ENCOUNTER — Other Ambulatory Visit: Payer: Self-pay | Admitting: Family Medicine

## 2018-12-01 ENCOUNTER — Other Ambulatory Visit: Payer: Self-pay

## 2018-12-01 ENCOUNTER — Ambulatory Visit: Payer: BLUE CROSS/BLUE SHIELD | Admitting: Family Medicine

## 2018-12-01 ENCOUNTER — Encounter: Payer: Self-pay | Admitting: Family Medicine

## 2018-12-01 VITALS — BP 122/78 | HR 89 | Temp 98.9°F | Resp 14 | Ht 64.0 in | Wt 172.0 lb

## 2018-12-01 DIAGNOSIS — L819 Disorder of pigmentation, unspecified: Secondary | ICD-10-CM

## 2018-12-01 NOTE — Progress Notes (Signed)
   Subjective:    Patient ID: Peggy Duncan, female    DOB: [birth year], 53 y.o.   MRN: [id number]  HPI Skin discoloration- pt reports that the skin 'right around my mouth got real dark'.  Noticed on Tuesday when in a dressing room.  Pt feels area remains dark.  No itching or swelling.  No one commented on discoloration or brought it to her attention.   Review of Systems For ROS see HPI     Objective:   Physical Exam Vitals signs reviewed.  Constitutional:      General: She is not in acute distress.    Appearance: Normal appearance. She is not ill-appearing or toxic-appearing.  Skin:    General: Skin is warm and dry.     Coloration: Skin is not jaundiced or pale.     Findings: No bruising, erythema, lesion or rash.     Comments: No hyperpigmentation noted  Neurological:     Mental Status: She is alert.  Psychiatric:        Mood and Affect: Mood normal.        Behavior: Behavior normal.        Thought Content: Thought content normal.           Assessment & Plan:  Hyperpigmentation- none present.  Reassured pt that her skin was normal.  Discussed that dressing room lighting is very harsh and can cast strange shadows.  No further work up needed as no abnormality present.

## 2018-12-01 NOTE — Patient Instructions (Signed)
Schedule your complete physical- we are overdue! Your skin looks great!  No cause for concern Continue to moisturize! Call with any questions or concerns Happy Spring!!!

## 2019-02-21 ENCOUNTER — Other Ambulatory Visit: Payer: Self-pay | Admitting: Family Medicine

## 2019-03-17 ENCOUNTER — Other Ambulatory Visit: Payer: Self-pay | Admitting: Family Medicine

## 2019-03-24 ENCOUNTER — Encounter: Payer: BLUE CROSS/BLUE SHIELD | Admitting: Family Medicine

## 2019-04-12 ENCOUNTER — Ambulatory Visit (INDEPENDENT_AMBULATORY_CARE_PROVIDER_SITE_OTHER): Payer: BC Managed Care – PPO | Admitting: Family Medicine

## 2019-04-12 ENCOUNTER — Encounter: Payer: Self-pay | Admitting: Family Medicine

## 2019-04-12 ENCOUNTER — Other Ambulatory Visit: Payer: Self-pay

## 2019-04-12 VITALS — BP 139/89 | HR 97 | Temp 97.9°F | Resp 16 | Ht 64.0 in | Wt 171.2 lb

## 2019-04-12 DIAGNOSIS — E663 Overweight: Secondary | ICD-10-CM

## 2019-04-12 DIAGNOSIS — I10 Essential (primary) hypertension: Secondary | ICD-10-CM | POA: Diagnosis not present

## 2019-04-12 DIAGNOSIS — Z1211 Encounter for screening for malignant neoplasm of colon: Secondary | ICD-10-CM

## 2019-04-12 DIAGNOSIS — Z0000 Encounter for general adult medical examination without abnormal findings: Secondary | ICD-10-CM | POA: Diagnosis not present

## 2019-04-12 DIAGNOSIS — E559 Vitamin D deficiency, unspecified: Secondary | ICD-10-CM

## 2019-04-12 LAB — HEPATIC FUNCTION PANEL
ALT: 22 U/L (ref 0–35)
AST: 28 U/L (ref 0–37)
Albumin: 4.4 g/dL (ref 3.5–5.2)
Alkaline Phosphatase: 81 U/L (ref 39–117)
Bilirubin, Direct: 0.1 mg/dL (ref 0.0–0.3)
Total Bilirubin: 0.9 mg/dL (ref 0.2–1.2)
Total Protein: 7.7 g/dL (ref 6.0–8.3)

## 2019-04-12 LAB — LIPID PANEL
Cholesterol: 204 mg/dL — ABNORMAL HIGH (ref 0–200)
HDL: 49.7 mg/dL (ref >39.00)
LDL Cholesterol: 132 mg/dL — ABNORMAL HIGH (ref 0–99)
NonHDL: 153.89
Total CHOL/HDL Ratio: 4
Triglycerides: 107 mg/dL (ref 0.0–149.0)
VLDL: 21.4 mg/dL (ref 0.0–40.0)

## 2019-04-12 LAB — CBC WITH DIFFERENTIAL/PLATELET
Basophils Absolute: 0 10*3/uL (ref 0.0–0.1)
Basophils Relative: 0.5 % (ref 0.0–3.0)
Eosinophils Absolute: 0.1 10*3/uL (ref 0.0–0.7)
Eosinophils Relative: 1.1 % (ref 0.0–5.0)
HCT: 39.4 % (ref 36.0–46.0)
Hemoglobin: 13.7 g/dL (ref 12.0–15.0)
Lymphocytes Relative: 47.1 % — ABNORMAL HIGH (ref 12.0–46.0)
Lymphs Abs: 2.9 10*3/uL (ref 0.7–4.0)
MCHC: 34.8 g/dL (ref 30.0–36.0)
MCV: 82.2 fl (ref 78.0–100.0)
Monocytes Absolute: 0.4 10*3/uL (ref 0.1–1.0)
Monocytes Relative: 5.9 % (ref 3.0–12.0)
Neutro Abs: 2.8 10*3/uL (ref 1.4–7.7)
Neutrophils Relative %: 45.4 % (ref 43.0–77.0)
Platelets: 324 10*3/uL (ref 150.0–400.0)
RBC: 4.8 Mil/uL (ref 3.87–5.11)
RDW: 14 % (ref 11.5–15.5)
WBC: 6.1 10*3/uL (ref 4.0–10.5)

## 2019-04-12 LAB — BASIC METABOLIC PANEL
BUN: 10 mg/dL (ref 6–23)
CO2: 28 mEq/L (ref 19–32)
Calcium: 9.9 mg/dL (ref 8.4–10.5)
Chloride: 103 mEq/L (ref 96–112)
Creatinine, Ser: 0.78 mg/dL (ref 0.40–1.20)
GFR: 93.12 mL/min (ref >60.00)
Glucose, Bld: 75 mg/dL (ref 70–99)
Potassium: 4 mEq/L (ref 3.5–5.1)
Sodium: 140 mEq/L (ref 135–145)

## 2019-04-12 LAB — VITAMIN D 25 HYDROXY (VIT D DEFICIENCY, FRACTURES): VITD: 18.2 ng/mL — ABNORMAL LOW (ref 30.00–100.00)

## 2019-04-12 LAB — TSH: TSH: 0.68 u[IU]/mL (ref 0.35–4.50)

## 2019-04-12 MED ORDER — LOSARTAN POTASSIUM 50 MG PO TABS: 50.0000 mg | ORAL_TABLET | Freq: Every day | ORAL | 1 refills | Status: DC

## 2019-04-12 NOTE — Assessment & Plan Note (Signed)
Chronic problem.  Adequate control- pt has white coat syndrome.  Currently asymptomatic.  Check labs.  No anticipated med changes.

## 2019-04-12 NOTE — Progress Notes (Signed)
   Subjective:    Patient ID: Peggy Duncan, female    DOB: [birth year], 53 y.o.   MRN: [id number]  HPI CPE- due for colonoscopy, mammo, and pap.  UTD on immunizations.   Review of Systems Patient reports no vision/ hearing changes, adenopathy,fever, weight change,  persistant/recurrent hoarseness , swallowing issues, chest pain, palpitations, edema, persistant/recurrent cough, hemoptysis, dyspnea (rest/exertional/paroxysmal nocturnal), gastrointestinal bleeding (melena, rectal bleeding), abdominal pain, significant heartburn, bowel changes, GU symptoms (dysuria, hematuria, incontinence), Gyn symptoms (abnormal  bleeding, pain),  syncope, focal weakness, memory loss, numbness & tingling, skin/hair/nail changes, abnormal bruising or bleeding, anxiety, or depression.     Objective:   Physical Exam General Appearance:    Alert, cooperative, no distress, appears stated age  Head:    Normocephalic, without obvious abnormality, atraumatic  Eyes:    PERRL, conjunctiva/corneas clear, EOM's intact, fundi    benign, both eyes  Ears:    Normal TM's and external ear canals, both ears  Nose:   Nares normal, septum midline, mucosa normal, no drainage    or sinus tenderness  Throat:   Lips, mucosa, and tongue normal; teeth and gums normal  Neck:   Supple, symmetrical, trachea midline, no adenopathy;    Thyroid: no enlargement/tenderness/nodules  Back:     Symmetric, no curvature, ROM normal, no CVA tenderness  Lungs:     Clear to auscultation bilaterally, respirations unlabored  Chest Wall:    No tenderness or deformity   Heart:    Regular rate and rhythm, S1 and S2 normal, no murmur, rub   or gallop  Breast Exam:    Deferred to GYN  Abdomen:     Soft, non-tender, bowel sounds active all four quadrants,    no masses, no organomegaly  Genitalia:    Deferred to GYN  Rectal:    Extremities:   Extremities normal, atraumatic, no cyanosis or edema  Pulses:   2+ and symmetric all extremities  Skin:   Skin  color, texture, turgor normal, no rashes or lesions  Lymph nodes:   Cervical, supraclavicular, and axillary nodes normal  Neurologic:   CNII-XII intact, normal strength, sensation and reflexes    throughout          Assessment & Plan:

## 2019-04-12 NOTE — Assessment & Plan Note (Signed)
Pt has hx of Vit D deficiency.  Check labs and replete prn. 

## 2019-04-12 NOTE — Assessment & Plan Note (Signed)
Pt's PE WNL w/ exception of being overweight.  UTD on pap.  Due for mammo.  Pt will do cologuard rather than colonoscopy.  Check labs.  Anticipatory guidance provided.

## 2019-04-12 NOTE — Assessment & Plan Note (Signed)
Ongoing issue for pt.  Stressed need for healthy diet and regular exercise.  Check labs to risk stratify.  Will follow 

## 2019-04-12 NOTE — Patient Instructions (Addendum)
Follow up in 6 months to recheck BP We'll notify you of your lab results and make any changes if needed Continue to work on healthy diet and regular exercise- you can do it! Take 1/2 of the Losartan 50mg  daily (keeping the dose at 25mg  daily) Complete the Cologuard and return as directed Call and schedule your mammogram Call with any questions or concerns Stay Safe!!

## 2019-04-13 ENCOUNTER — Other Ambulatory Visit: Payer: Self-pay | Admitting: Family Medicine

## 2019-04-13 ENCOUNTER — Other Ambulatory Visit: Payer: Self-pay | Admitting: General Practice

## 2019-04-13 MED ORDER — VITAMIN D (ERGOCALCIFEROL) 1.25 MG (50000 UNIT) PO CAPS: 50000.0000 [IU] | ORAL_CAPSULE | ORAL | 0 refills | Status: DC

## 2019-04-21 ENCOUNTER — Telehealth: Payer: Self-pay | Admitting: *Deleted

## 2019-04-21 ENCOUNTER — Other Ambulatory Visit: Payer: Self-pay | Admitting: General Practice

## 2019-04-21 MED ORDER — VALSARTAN 80 MG PO TABS: 80.0000 mg | ORAL_TABLET | Freq: Every day | ORAL | 0 refills | Status: DC

## 2019-04-21 NOTE — Telephone Encounter (Signed)
Please ask pt which [facility] she prefers and we can try.  If not available, we can try Valsartan 80mg 

## 2019-04-21 NOTE — Telephone Encounter (Signed)
Please advise 

## 2019-04-21 NOTE — Telephone Encounter (Signed)
Patient called back to say that she is taking 25mg  of [facility] and [facility] .  She is asking if a RX be sent to [facility] to see if they have it and how much it would be there.    If she cannot afford it she will call back to see what Dr. Birdie Riddle would like her to do.

## 2019-04-21 NOTE — Telephone Encounter (Signed)
Called and spoke with pt, she asked if I could just send in the Valsartan Rx. Chart updated to reflect and medication was filled.

## 2019-04-26 ENCOUNTER — Telehealth: Payer: Self-pay | Admitting: Family Medicine

## 2019-04-26 MED ORDER — VALSARTAN 80 MG PO TABS: 80.0000 mg | ORAL_TABLET | Freq: Every day | ORAL | 0 refills | Status: DC

## 2019-04-26 NOTE — Addendum Note (Signed)
Addended by: Davis Gourd on: 04/26/2019 02:59 PM   Modules accepted: Orders

## 2019-04-26 NOTE — Telephone Encounter (Signed)
Medication filled to pharmacy as requested.   

## 2019-04-26 NOTE — Telephone Encounter (Signed)
Error

## 2019-04-26 NOTE — Telephone Encounter (Signed)
Can you [facility] in [facility] [address] [facility] [place] pt states when she went to go the pharmacy they told her they never got a script for the medication. Pt can be reached at the cell #

## 2019-06-28 DIAGNOSIS — S51801A Unspecified open wound of right forearm, initial encounter: Secondary | ICD-10-CM | POA: Diagnosis not present

## 2019-06-28 DIAGNOSIS — Z23 Encounter for immunization: Secondary | ICD-10-CM | POA: Diagnosis not present

## 2019-06-28 DIAGNOSIS — I10 Essential (primary) hypertension: Secondary | ICD-10-CM | POA: Diagnosis not present

## 2019-06-28 DIAGNOSIS — W540XXA Bitten by dog, initial encounter: Secondary | ICD-10-CM | POA: Diagnosis not present

## 2019-07-11 ENCOUNTER — Other Ambulatory Visit: Payer: Self-pay | Admitting: Family Medicine

## 2019-07-14 ENCOUNTER — Telehealth: Payer: Self-pay | Admitting: Family Medicine

## 2019-07-14 DIAGNOSIS — N63 Unspecified lump in unspecified breast: Secondary | ICD-10-CM

## 2019-07-14 NOTE — Telephone Encounter (Signed)
Please advise 

## 2019-07-14 NOTE — Telephone Encounter (Signed)
The imaging center will determine if [place] is needed/appropriate.  I do not order those, I order the diagnostic mammo and then the breast radiologists determine what is needed.

## 2019-07-14 NOTE — Addendum Note (Signed)
Addended by: Davis Gourd on: 07/14/2019 03:27 PM   Modules accepted: Orders

## 2019-07-14 NOTE — Telephone Encounter (Signed)
Pt also wanted a breast US, can we order that as well?

## 2019-07-14 NOTE — Telephone Encounter (Signed)
Ok for diagnostic mammo- dx breast mass

## 2019-07-14 NOTE — Telephone Encounter (Signed)
Pt called in asking if we could send an order to [facility] for a diagnostic Mammo and breast ultrasound. She states that she has a small lump/hard spot under her breast and wanted to get it checked out. Pt can be reached at the home #  [facility] phone # is [phone]

## 2019-07-14 NOTE — Telephone Encounter (Signed)
U/S order placed

## 2019-07-14 NOTE — Telephone Encounter (Signed)
This order was placed today. 

## 2019-08-02 DIAGNOSIS — R928 Other abnormal and inconclusive findings on diagnostic imaging of breast: Secondary | ICD-10-CM | POA: Diagnosis not present

## 2019-08-02 DIAGNOSIS — N6310 Unspecified lump in the right breast, unspecified quadrant: Secondary | ICD-10-CM | POA: Diagnosis not present

## 2019-08-02 DIAGNOSIS — Z1231 Encounter for screening mammogram for malignant neoplasm of breast: Secondary | ICD-10-CM | POA: Diagnosis not present

## 2019-08-02 DIAGNOSIS — N63 Unspecified lump in unspecified breast: Secondary | ICD-10-CM | POA: Diagnosis not present

## 2019-08-02 LAB — HM MAMMOGRAPHY

## 2019-08-08 ENCOUNTER — Encounter: Payer: Self-pay | Admitting: General Practice

## 2019-08-14 ENCOUNTER — Other Ambulatory Visit: Payer: Self-pay

## 2019-08-14 ENCOUNTER — Ambulatory Visit: Payer: BC Managed Care – PPO | Admitting: Family Medicine

## 2019-08-14 ENCOUNTER — Encounter: Payer: Self-pay | Admitting: Family Medicine

## 2019-08-14 VITALS — BP 139/90 | HR 109 | Temp 97.9°F | Resp 16 | Ht 64.0 in | Wt 168.2 lb

## 2019-08-14 DIAGNOSIS — G8929 Other chronic pain: Secondary | ICD-10-CM

## 2019-08-14 DIAGNOSIS — M546 Pain in thoracic spine: Secondary | ICD-10-CM | POA: Diagnosis not present

## 2019-08-14 DIAGNOSIS — N62 Hypertrophy of breast: Secondary | ICD-10-CM | POA: Diagnosis not present

## 2019-08-14 DIAGNOSIS — M545 Low back pain, unspecified: Secondary | ICD-10-CM

## 2019-08-14 MED ORDER — CYCLOBENZAPRINE HCL 10 MG PO TABS: 10.0000 mg | ORAL_TABLET | Freq: Every day | ORAL | 0 refills | Status: DC

## 2019-08-14 MED ORDER — MELOXICAM 15 MG PO TABS: 15.0000 mg | ORAL_TABLET | Freq: Every day | ORAL | 0 refills | Status: DC

## 2019-08-14 NOTE — Progress Notes (Signed)
   Subjective:    Patient ID: Peggy Duncan, female    DOB: 1966/04/06, 53 y.o.   MRN: [id number]  HPI Back pain- has had sxs 'off and on for the last few years' but has been worsening recently.  Has LBP that is more noticeable w/ poor posture.  Heat and ice provide temporary relief, as does tylenol/ibuprofen and stretching.  Is also having shoulder and mid-thoracic back pain due to weight of breasts.  Sisters have similar pain and have required breast reductions.  No bowel or bladder incontinence, no numbness/tingling.  Pain is typically a 7-8/10 but when severe can be 10/10 per pt report.  Pt is reporting bra indentations regularly- 36H or 38G.  Has tried multiple kinds of bras w/o relief.   Review of Systems For ROS see HPI     Objective:   Physical Exam Vitals signs reviewed.  Constitutional:      General: She is not in acute distress.    Appearance: Normal appearance. She is not ill-appearing.  HENT:     Head: Normocephalic and atraumatic.  Neck:     Musculoskeletal: Normal range of motion.     Comments: Bilateral trap spasm Musculoskeletal:     Comments: No TTP over spine + trap spasm bilaterally + lumbar paraspinal spasm bilaterally  Skin:    Comments: Bra indentations on both shoulders and around torso  Neurological:     General: No focal deficit present.     Mental Status: She is alert and oriented to person, place, and time.  Psychiatric:        Mood and Affect: Mood normal.        Behavior: Behavior normal.        Thought Content: Thought content normal.           Assessment & Plan:  Lumbar/thoracic back pain- new to provider, ongoing for pt.  Most likely due to her large breasts that cause her to hunch forward.  Discussed need for good posture but this is very difficult for pt.  Will refer to PT to work on back and core strength.  Will start scheduled NSAIDs and muscle relaxer at night for symptom relief.  Will also refer to plastic surgeon to discuss breast  reduction given that she wears a 38G or 36H.  Pt expressed understanding and is in agreement w/ plan.

## 2019-08-14 NOTE — Patient Instructions (Signed)
Follow up as needed or as scheduled We'll call you with your PT and plastic surgery appts START the meloxicam once daily- take w/ food Use the cyclobenzaprine nightly for spasm and sleep Call with any questions or concerns Hang in there!!

## 2019-08-28 ENCOUNTER — Encounter: Payer: Self-pay | Admitting: Physical Therapy

## 2019-08-28 ENCOUNTER — Other Ambulatory Visit: Payer: Self-pay

## 2019-08-28 ENCOUNTER — Ambulatory Visit: Payer: BC Managed Care – PPO | Attending: Family Medicine | Admitting: Physical Therapy

## 2019-08-28 DIAGNOSIS — G8929 Other chronic pain: Secondary | ICD-10-CM

## 2019-08-28 DIAGNOSIS — M545 Low back pain: Secondary | ICD-10-CM | POA: Diagnosis not present

## 2019-08-28 DIAGNOSIS — R293 Abnormal posture: Secondary | ICD-10-CM | POA: Insufficient documentation

## 2019-08-28 DIAGNOSIS — M6281 Muscle weakness (generalized): Secondary | ICD-10-CM | POA: Insufficient documentation

## 2019-08-28 DIAGNOSIS — M62830 Muscle spasm of back: Secondary | ICD-10-CM | POA: Insufficient documentation

## 2019-08-28 NOTE — Therapy (Addendum)
[facility] [place] [address]  [place], [place], [address] Phone: [phone]   Fax:  [phone]  Physical Therapy Evaluation  Patient Details  Name: Peggy Duncan MRN: [id number] Date of Birth: 12/20/1965 Referring Provider (PT): Annye Asa, MD   Encounter Date: 08/28/2019  PT End of Session - 08/28/19 1100    Visit Number  1    Number of Visits  12    Date for PT Re-Evaluation  10/09/19    Authorization Type  BCBS [place] (medical review after visit 103)    PT Start Time  1100    PT Stop Time  1201    PT Time Calculation (min)  61 min    Activity Tolerance  Patient tolerated treatment well    Behavior During Therapy  [facility] for tasks assessed/performed       Past Medical History:  Diagnosis Date  . Hypertension   . Pre-eclampsia     History reviewed. No pertinent surgical history.  There were no vitals filed for this visit.   Subjective Assessment - 08/28/19 1103    Subjective  Pt reports 2+ year h/o low back issues - constant pressure in low back creating difficulty finding comfortable position. No known MOI - gradual onset. Feels that some of her pain results from larger breasts.    Limitations  Sitting;Standing    How long can you sit comfortably?  10-15 minutes    How long can you stand comfortably?  15-20 minutes    Patient Stated Goals  "to see if exercise can help my back"    Currently in Pain?  Yes    Pain Score  5    up to 8/10 at worst   Pain Location  Back    Pain Orientation  Lower    Pain Descriptors / Indicators  Dull;Aching;Pressure    Pain Type  Chronic pain    Pain Radiating Towards  n/a    Pain Onset  More than a month ago    Pain Frequency  Constant   varies in intensity   Aggravating Factors   prolonged sitting (esp unsupported); stationary standing    Pain Relieving Factors  ibuprofen, stretches    Effect of Pain on Daily Activities  requires frequent positional changes,  needs support for back         [facility] PT Assessment - 08/28/19 1100      Assessment   Medical Diagnosis  Chronic LBP    Referring Provider (PT)  Annye Asa, MD    Onset Date/Surgical Date  --   2 years   Hand Dominance  Right    Next MD Visit  10/11/19    Prior Therapy  none      Precautions   Precautions  None      Balance Screen   Has the patient fallen in the past 6 months  No    Has the patient had a decrease in activity level because of a fear of falling?   No    Is the patient reluctant to leave their home because of a fear of falling?   No      Home Environment   Living Environment  Private residence    Type of [place] Access  Stairs to enter    Entrance Stairs-Number of Steps  4    Entrance Stairs-Rails  Right;Left    Home Layout  Two level  Prior Function   Level of Independence  Independent    Vocation  Full time employment;Works at [employer] stage [employer] work    Leisure  exercise 3-4x/wk (gym - TM & machines/free weights/core/Zumba 2x/wk & home - bench/free weights/core), walking (10 miles/week on average); reading, cleaning      Cognition   Overall Cognitive Status  Within Functional Limits for tasks assessed    Behaviors  Restless;Confabulation      Observation/Other Assessments   Focus on Therapeutic Outcomes (FOTO)   Lumbar spine - 53% (47% limitation); Predicted 64% (36% limitation)      ROM / Strength   AROM / PROM / Strength  AROM;Strength      AROM   AROM Assessment Site  Lumbar    Lumbar Flexion  hands to ankles    Lumbar Extension  30% limited - increased LBP    Lumbar - Right Side Bend  hand to just below fibular head    Lumbar - Left Side Bend  hand to lateral knee    Lumbar - Right Rotation  40% limited     Lumbar - Left Rotation  25% limited      Strength   Strength Assessment Site  Hip;Knee    Right/Left Hip  Right;Left    Right Hip Flexion  4+/5    Right Hip Extension  4-/5    Right Hip  External Rotation   4/5    Right Hip Internal Rotation  4+/5    Right Hip ABduction  4-/5    Right Hip ADduction  4-/5    Left Hip Flexion  4+/5    Left Hip Extension  4-/5    Left Hip External Rotation  4/5    Left Hip Internal Rotation  4+/5    Left Hip ABduction  4/5    Left Hip ADduction  4-/5    Right/Left Knee  Right;Left    Right Knee Flexion  4+/5    Right Knee Extension  5/5    Left Knee Flexion  4/5    Left Knee Extension  5/5      Flexibility   Soft Tissue Assessment /Muscle Length  yes    Hamstrings  mild tight B    Quadriceps  mild tight B with mod tight L hip flexors    ITB  mild/mod tight B    Piriformis  mod tight B      Palpation   Palpation comment  ttp in B lumbaer paraspinals                Objective measurements completed on examination: See above findings.      [facility] Adult PT Treatment/Exercise - 08/28/19 1100      Exercises   Exercises  Lumbar      Lumbar Exercises: Stretches   Passive Hamstring Stretch  Right;Left;30 seconds;1 rep    Passive Hamstring Stretch Limitations  supine with strap    Single Knee to Chest Stretch  Right;Left;30 seconds    Single Knee to Chest Stretch Limitations  with opp LE straight and in mod thomas position    Lower Trunk Rotation  30 seconds;1 rep    ITB Stretch  Right;Left;30 seconds;1 rep    ITB Stretch Limitations  supine with strap    Piriformis Stretch  Right;Left;30 seconds;1 rep    Piriformis Stretch Limitations  KTOS    Figure 4 Stretch  30 seconds;1 rep;Supine;With overpressure    Figure 4 Stretch Limitations  figure 4 to chest      Lumbar Exercises: Supine   Pelvic Tilt  10 reps;5 seconds    Bridge  10 reps;5 seconds             PT Education - 08/28/19 1200    Education Details  PT eval findings, anticipated POC & initial HEP    Person(s) Educated  Patient    Methods  Explanation;Demonstration;Handout    Comprehension  Verbalized understanding;Returned demonstration;Need further  instruction       PT Short Term Goals - 08/28/19 1201      PT SHORT TERM GOAL #1   Title  Patient will be independent with initial HEP    Status  New    Target Date  09/18/19      PT SHORT TERM GOAL #2   Title  Patient will verbalize/demonstrate understanding of neutral spine posture and proper body mechanics to reduce strain on lumbar spine    Status  New    Target Date  09/18/19        PT Long Term Goals - 08/28/19 1201      PT LONG TERM GOAL #1   Title  Patient will be independent with ongoing/advanced HEP +/- gym program    Status  New    Target Date  10/09/19      PT LONG TERM GOAL #2   Title  Patient to demonstrate appropriate posture and body mechanics needed for daily activities    Status  New    Target Date  10/09/19      PT LONG TERM GOAL #3   Title  Patient to improve lumbar AROM to WNL without pain provocation    Status  New    Target Date  10/09/19      PT LONG TERM GOAL #4   Title  Patient will increase B LE strength to >/= 4+/5 for improved proximal stability    Status  New    Target Date  10/09/19      PT LONG TERM GOAL #5   Title  Patient to report ability to perform ADLs, household and work-related tasks without increased pain    Status  New    Target Date  10/09/19             Plan - 08/28/19 1201    Clinical Impression Statement  Peggy Duncan is a 53 y/o female who presents to OP PT for chronic LBP x 69yrs with gradual exacerbation without known MOI, however she feels that her larger breasts contribute to her back pain. Back pain limits her standing and sitting tolerance, especially with unsupported sitting, as well as laying on her back, and hence creates difficulty finding a comfortable position. Lumbar AROM mildly limited due to tightness but no significant increased pain reported. Mild to moderate decreased proximal LE flexibility and proximal LE weakness evident along with ttp along B lumbar paraspinals, however patient denies ttp in buttocks  or radicular LE symptoms. Reily will benefit from skilled PT services to address aforementioned impairments and allow for increased participation in desired activities w/o pain interference.    Personal Factors and Comorbidities  Comorbidity 2    Comorbidities  HTN, orthostatic hypotension    Examination-Activity Limitations  Sit;Stairs;Stand    Stability/Clinical Decision Making  Stable/Uncomplicated    Clinical Decision Making  Low    Rehab Potential  Good    PT Frequency  2x / week    PT Duration  6 weeks  PT Treatment/Interventions  ADLs/Self Care Home Management;Cryotherapy;Electrical Stimulation;Iontophoresis 4mg /ml Dexamethasone;Moist Heat;Traction;Ultrasound;Gait training;Stair training;Functional mobility training;Therapeutic activities;Therapeutic exercise;Balance training;Neuromuscular re-education;Patient/family education;Manual techniques;Passive range of motion;Dry needling;Taping;Spinal Manipulations    PT Next Visit Plan  Review initial HEP; posture and body mechanics education; lumbopelvic flexibilty and strengthening/stablization; manual therapy & modalities PRN    PT Home Exercise Plan  08/28/19 - LE stretches: HS, ITB, hip flexors, SKTC, piriformis & LTR, pelvic tilts & bridges    Consulted and Agree with Plan of Care  Patient       Patient will benefit from skilled therapeutic intervention in order to improve the following deficits and impairments:  Decreased activity tolerance, Decreased knowledge of precautions, Decreased range of motion, Decreased strength, Increased muscle spasms, Impaired perceived functional ability, Impaired flexibility, Improper body mechanics, Postural dysfunction, Pain  Visit Diagnosis: Chronic bilateral low back pain without sciatica - Plan: PT plan of care cert/re-cert  Muscle weakness (generalized) - Plan: PT plan of care cert/re-cert  Muscle spasm of back - Plan: PT plan of care cert/re-cert  Abnormal posture - Plan: PT plan of care  cert/re-cert     Problem List Patient Active Problem List   Diagnosis Date Noted  . Overweight (BMI 25.0-29.9) 04/12/2019  . Fibroids, intramural 10/27/2018  . Vitamin D deficiency 05/25/2017  . Physical exam 12/05/2015  . Hot flashes, menopausal 05/15/2015  . Allergic rhinitis, seasonal 03/01/2012  . Hypertension 02/27/2011  . Mass of axilla 02/27/2011  . HYPOTENSION-ORTHOSTATIC 08/07/2010  . DIZZINESS 08/07/2010    Percival Spanish, PT, MPT 08/28/2019, 1:09 PM  [facility] [address]  [place], [place], [address] Phone: [phone]   Fax:  [phone]  Name: Peggy Duncan MRN: [id number] Date of Birth: [birth year]

## 2019-08-28 NOTE — Patient Instructions (Signed)
    Home exercise program created by JoAnne Kreis, PT.  For questions, please contact JoAnne via phone at [phone] or email at [email or web address]  [facility] [place] [address]  [address] [place], [place], [address] Phone: [phone]   Fax:  [phone]    

## 2019-09-12 ENCOUNTER — Other Ambulatory Visit: Payer: Self-pay | Admitting: Family Medicine

## 2019-09-13 DIAGNOSIS — N62 Hypertrophy of breast: Secondary | ICD-10-CM | POA: Diagnosis not present

## 2019-09-18 ENCOUNTER — Other Ambulatory Visit: Payer: Self-pay

## 2019-09-18 ENCOUNTER — Encounter: Payer: Self-pay | Admitting: Physical Therapy

## 2019-09-18 ENCOUNTER — Ambulatory Visit: Payer: BC Managed Care – PPO | Attending: Family Medicine | Admitting: Physical Therapy

## 2019-09-18 DIAGNOSIS — M545 Low back pain, unspecified: Secondary | ICD-10-CM

## 2019-09-18 DIAGNOSIS — R293 Abnormal posture: Secondary | ICD-10-CM | POA: Diagnosis not present

## 2019-09-18 DIAGNOSIS — M6281 Muscle weakness (generalized): Secondary | ICD-10-CM | POA: Diagnosis not present

## 2019-09-18 DIAGNOSIS — M62830 Muscle spasm of back: Secondary | ICD-10-CM | POA: Insufficient documentation

## 2019-09-18 DIAGNOSIS — G8929 Other chronic pain: Secondary | ICD-10-CM | POA: Insufficient documentation

## 2019-09-18 NOTE — Patient Instructions (Addendum)

## 2019-09-18 NOTE — Therapy (Addendum)
[facility] [place] [facility] [address]  [place], [place], [address] Phone: [phone]   Fax:  [phone]  Physical Therapy Treatment / Discharge Summary  Patient Details  Name: Peggy Duncan MRN: [id number] Date of Birth: [birth year] Referring Provider (PT): Annye Asa, MD   Encounter Date: 09/18/2019  PT End of Session - 09/18/19 0810    Visit Number  2    Number of Visits  12    Date for PT Re-Evaluation  10/09/19    Authorization Type  BCBS [place] (medical [place] after visit 1)    PT Start Time  0810    PT Stop Time  0853    PT Time Calculation (min)  43 min    Activity Tolerance  Patient tolerated treatment well    Behavior During Therapy  [facility] for tasks assessed/performed       Past Medical History:  Diagnosis Date  . Hypertension   . Pre-eclampsia     History reviewed. No pertinent surgical history.  There were no vitals filed for this visit.  Subjective Assessment - 09/18/19 0814    Subjective  Pt reports HEP "has been doing pretty good". States she has not been able to return to PT until now due to caring for her husband and daughter after surgeries.    Patient Stated Goals  "to see if exercise can help my back"    Currently in Pain?  No/denies    Pain Score  0-No pain                       [facility] Adult PT Treatment/Exercise - 09/18/19 0810      Self-Care   Self-Care  Posture    Posture  Provided education in proper posture and body mechanics for typical daily tasks and household chores - patient verbalzing areas for potenital improvement in her daily routine      Exercises   Exercises  Lumbar      Lumbar Exercises: Stretches   Passive Hamstring Stretch  Right;Left;30 seconds;1 rep    Passive Hamstring Stretch Limitations  supine with strap    Single Knee to Chest Stretch  Right;Left;30 seconds;1 rep    Single Knee to Chest Stretch Limitations  with opp LE in mod thomas position     Lower Trunk Rotation  30 seconds;2 reps    Hip Flexor Stretch  Right;Left;30 seconds;1 rep    Hip Flexor Stretch Limitations  mod thomas with strap    ITB Stretch  Right;Left;30 seconds;1 rep    ITB Stretch Limitations  supine with strap    Piriformis Stretch  Right;Left;30 seconds;1 rep    Piriformis Stretch Limitations  KTOS    Figure 4 Stretch  30 seconds;1 rep;Supine;With overpressure    Figure 4 Stretch Limitations  figure 4 to chest      Lumbar Exercises: Aerobic   Recumbent Bike  L2 x 6 min      Lumbar Exercises: Standing   Row  Both;10 reps;Strengthening    Theraband Level (Row)  Level 2 (Red)    Row Limitations  staggered stance with cues for abd bracing & scap retraction, avoiding excessive shoulder extension or upward rotation of elbows    Shoulder Extension  Both;10 reps;Theraband;Strengthening    Theraband Level (Shoulder Extension)  Level 2 (Red)    Shoulder Extension Limitations  staggered stance with cues for abd bracing & scap retraction      Lumbar Exercises:  Supine   Clam  10 reps;3 seconds    Clam Limitations  hooklying alt hip ABD/ER with red TB    Bridge  10 reps;5 seconds    Bridge Limitations  + red TB hip ABD isometric      Lumbar Exercises: Sidelying   Clam  Right;Left;10 reps;3 seconds    Clam Limitations  red TB             PT Education - 09/18/19 0817    Education Details  Posture & body mechanics education for typical daily tasks; HEP update - progression of lumbopelvic/core strengthening    Person(s) Educated  Patient    Methods  Explanation;Demonstration;Handout    Comprehension  Verbalized understanding;Returned demonstration;Need further instruction       PT Short Term Goals - 09/18/19 0815      PT SHORT TERM GOAL #1   Title  Patient will be independent with initial HEP    Status  On-going    Target Date  09/18/19      PT SHORT TERM GOAL #2   Title  Patient will verbalize/demonstrate understanding of neutral spine posture and  proper body mechanics to reduce strain on lumbar spine    Status  On-going    Target Date  09/18/19        PT Long Term Goals - 09/18/19 0815      PT LONG TERM GOAL #1   Title  Patient will be independent with ongoing/advanced HEP +/- gym program    Status  On-going    Target Date  10/09/19      PT LONG TERM GOAL #2   Title  Patient to demonstrate appropriate posture and body mechanics needed for daily activities    Status  On-going    Target Date  10/09/19      PT LONG TERM GOAL #3   Title  Patient to improve lumbar AROM to WNL without pain provocation    Status  On-going    Target Date  10/09/19      PT LONG TERM GOAL #4   Title  Patient will increase B LE strength to >/= 4+/5 for improved proximal stability    Status  On-going    Target Date  10/09/19      PT LONG TERM GOAL #5   Title  Patient to report ability to perform ADLs, household and work-related tasks without increased pain    Status  On-going    Target Date  10/09/19            Plan - 09/18/19 0816    Clinical Impression Statement  Peggy Duncan returning for first visit since eval on 11/30. She reports HEP has been going well but did require minor clarification of proper positioning and technique with several stretches and exercises. Able to progress core and lumbopelvic strengthening today with good tolerance and HEP updated accordingly. Session concluded with education for proper posture and body mechanics with mobility and typical daily work and household tasks, with patient verbalizing areas for potential improvement in her daily routine. STGs not yet met due to only one treatment visit since eval.    Comorbidities  HTN, orthostatic hypotension    Examination-Activity Limitations  Sit;Stairs;Stand    Rehab Potential  Good    PT Frequency  2x / week    PT Duration  6 weeks    PT Treatment/Interventions  ADLs/Self Care Home Management;Cryotherapy;Electrical Stimulation;Iontophoresis 79m/ml Dexamethasone;Moist  Heat;Traction;Ultrasound;Gait training;Stair training;Functional mobility training;Therapeutic activities;Therapeutic exercise;Balance training;Neuromuscular re-education;Patient/family education;Manual  techniques;Passive range of motion;Dry needling;Taping;Spinal Manipulations    PT Next Visit Plan  Review HEP as indicated; address questions or conerns re: posture and body mechanics education; lumbopelvic flexibilty and strengthening/stablization; manual therapy & modalities PRN    PT Home Exercise Plan  08/28/19 - LE stretches: HS, ITB, hip flexors, SKTC, piriformis & LTR, pelvic tilts & bridges (progressed to red TB hip ABD isometric as of 09/18/19); 09/18/19 - bridge progression, hooklying & sidelying clams with red TB, abd bracing + rows/retraction with red TB    Consulted and Agree with Plan of Care  Patient       Patient will benefit from skilled therapeutic intervention in order to improve the following deficits and impairments:  Decreased activity tolerance, Decreased knowledge of precautions, Decreased range of motion, Decreased strength, Increased muscle spasms, Impaired perceived functional ability, Impaired flexibility, Improper body mechanics, Postural dysfunction, Pain  Visit Diagnosis: Chronic bilateral low back pain without sciatica  Muscle weakness (generalized)  Muscle spasm of back  Abnormal posture     Problem List Patient Active Problem List   Diagnosis Date Noted  . Overweight (BMI 25.0-29.9) 04/12/2019  . Fibroids, intramural 10/27/2018  . Vitamin D deficiency 05/25/2017  . Physical exam 12/05/2015  . Hot flashes, menopausal 05/15/2015  . Allergic rhinitis, seasonal 03/01/2012  . Hypertension 02/27/2011  . Mass of axilla 02/27/2011  . HYPOTENSION-ORTHOSTATIC 08/07/2010  . DIZZINESS 08/07/2010    Percival Spanish 09/18/2019, 9:11 AM  [facility] [address]  [place], [place],  [address] Phone: [phone]   Fax:  [phone]  Name: Peggy Duncan MRN: [id number] Date of Birth: [birth year]   PHYSICAL THERAPY DISCHARGE SUMMARY  Visits from Start of Care: 2  Current functional level related to goals / functional outcomes:   Refer to above clinical impression for status as of last visit on 09/18/2019. Patient has not returned to PT in 30 days, therefore will proceed with discharge from PT for this episode.   Remaining deficits:   As above. Unable to formally assess status as of discharge due to failure to return.   Education / Equipment:   HEP; Biomedical scientist education  Plan: Patient agrees to discharge.  Patient goals were not met. Patient is being discharged due to not returning since the last visit.  ?????    Percival Spanish, PT, MPT 10/18/19, 9:57 AM  [facility] [address]  [place], [place], [address] Phone: [phone]   Fax:  [phone]

## 2019-09-24 ENCOUNTER — Other Ambulatory Visit: Payer: Self-pay | Admitting: Family Medicine

## 2019-09-25 NOTE — Telephone Encounter (Signed)
Last office visit- 08/14/2019 flexeril last filled- 08/14/2019 #30 with 0

## 2019-09-27 ENCOUNTER — Ambulatory Visit: Payer: BC Managed Care – PPO | Attending: Internal Medicine

## 2019-09-27 DIAGNOSIS — U071 COVID-19: Secondary | ICD-10-CM

## 2019-09-27 DIAGNOSIS — R238 Other skin changes: Secondary | ICD-10-CM

## 2019-09-29 LAB — NOVEL CORONAVIRUS, NAA: SARS-CoV-2, NAA: DETECTED — AB

## 2019-10-02 ENCOUNTER — Telehealth: Payer: Self-pay | Admitting: Family Medicine

## 2019-10-02 NOTE — Telephone Encounter (Signed)
Spoke with patient regarding quarantine period. Patient advise that she would count from first day of her symptoms

## 2019-10-02 NOTE — Telephone Encounter (Signed)
Pt states she had a missed call, she has covid 19 results. Please call [phone] Thanks

## 2019-10-07 ENCOUNTER — Other Ambulatory Visit: Payer: Self-pay | Admitting: Family Medicine

## 2019-10-11 ENCOUNTER — Ambulatory Visit: Payer: BC Managed Care – PPO | Admitting: Family Medicine

## 2019-10-18 ENCOUNTER — Ambulatory Visit (INDEPENDENT_AMBULATORY_CARE_PROVIDER_SITE_OTHER): Payer: 59 | Admitting: Family Medicine

## 2019-10-18 ENCOUNTER — Other Ambulatory Visit: Payer: Self-pay

## 2019-10-18 ENCOUNTER — Encounter: Payer: Self-pay | Admitting: Family Medicine

## 2019-10-18 DIAGNOSIS — I10 Essential (primary) hypertension: Secondary | ICD-10-CM | POA: Diagnosis not present

## 2019-10-18 DIAGNOSIS — Z8616 Personal history of COVID-19: Secondary | ICD-10-CM

## 2019-10-18 NOTE — Progress Notes (Signed)
I have discussed the procedure for the virtual visit with the patient who has given consent to proceed with assessment and treatment.  Patient unable to obtain vital signs.  Fritz Pickerel, CMA

## 2019-10-18 NOTE — Progress Notes (Signed)
   Virtual Visit via Video   I connected with patient on 10/18/19 at 11:30 AM EST by a video enabled telemedicine application and verified that I am speaking with the correct person using two identifiers.  Location patient: Home Location provider: Acupuncturist, Office Persons participating in the virtual visit: Patient, Provider, [place]geman (Jess C)  I discussed the limitations of evaluation and management by telemedicine and the availability of in person appointments. The patient expressed understanding and agreed to proceed.  Subjective:   HPI:   HTN- chronic problem.  On valsartan.  No CP, SOB, HAs ,visual changes, edema.  Was exercising until she got COVID.    COVID- pt tested + on 12/30.  No residual SOB or cough.    ROS:   See pertinent positives and negatives per HPI.  Patient Active Problem List   Diagnosis Date Noted  . Overweight (BMI 25.0-29.9) 04/12/2019  . Fibroids, intramural 10/27/2018  . Vitamin D deficiency 05/25/2017  . Physical exam 12/05/2015  . Hot flashes, menopausal 05/15/2015  . Allergic rhinitis, seasonal 03/01/2012  . Hypertension 02/27/2011  . Mass of axilla 02/27/2011  . HYPOTENSION-ORTHOSTATIC 08/07/2010  . DIZZINESS 08/07/2010    Social History   Tobacco Use  . Smoking status: Never Smoker  . Smokeless tobacco: Never Used  Substance Use Topics  . Alcohol use: No    Current Outpatient Medications:  .  azithromycin (ZITHROMAX) 250 MG tablet, Take by mouth as directed., Disp: , Rfl:  .  cyclobenzaprine (FLEXERIL) 10 MG tablet, TAKE 1 TABLET BY MOUTH EVERYDAY AT BEDTIME, Disp: 30 tablet, Rfl: 0 .  meloxicam (MOBIC) 15 MG tablet, TAKE 1 TABLET BY MOUTH EVERY DAY, Disp: 30 tablet, Rfl: 0 .  Multiple Vitamins-Minerals (MULTIVITAMIN ADULT PO), Take by mouth., Disp: , Rfl:  .  valsartan (DIOVAN) 80 MG tablet, TAKE 1 TABLET BY MOUTH EVERY DAY, Disp: 90 tablet, Rfl: 0  No Known Allergies  Objective:   There were no vitals taken for this  visit. AAOx3, NAD NCAT, EOMI No obvious CN deficits Coloring WNL Pt is able to speak clearly, coherently without shortness of breath or increased work of breathing.  Thought process is linear.  Mood is appropriate.   Assessment and Plan:   HTN- chronic problem, tolerating Valsartan.  Unable to check BP at home today.  Will check when she comes in for labs.  Adjust meds prn.  COVID- pt is recovering nicely.  No residual SOB or cough.   Annye Asa, MD 10/18/2019

## 2019-10-26 ENCOUNTER — Telehealth: Payer: Self-pay | Admitting: Family Medicine

## 2019-10-26 NOTE — Telephone Encounter (Signed)
Birdie Riddle would like pt to come in for labs/BP ck from her appt on 10/18/2019. She would also like her to scheduled her Cpe in July.

## 2020-01-17 ENCOUNTER — Other Ambulatory Visit: Payer: Self-pay | Admitting: Family Medicine

## 2020-01-17 ENCOUNTER — Telehealth: Payer: Self-pay | Admitting: Family Medicine

## 2020-01-17 NOTE — Telephone Encounter (Signed)
This medication was already filled this morning.

## 2020-01-17 NOTE — Telephone Encounter (Signed)
  LAST APPOINTMENT DATE: 10/19/2019   NEXT APPOINTMENT DATE:@Visit  date not found  MEDICATION:valsartan (DIOVAN) 80 MG tablet  PHARMACY: [id number] - [place], [place] - [address] AT [place] states she contacted her pharmacy for a refill and they told her that she is eligible for the Losartan again and if she wanted so she asked for Tabori's opinion on whether or not she should continue Valsartan or discontinue.   **Let patient know to contact pharmacy at the end of the day to make sure medication is ready. **  ** Please notify patient to allow 48-72 hours to process**  **Encourage patient to contact the pharmacy for refills or they can request refills through [facility]**  CLINICAL FILLS OUT ALL BELOW:   LAST REFILL:  QTY:  REFILL DATE:    OTHER COMMENTS:    Okay for refill?  Please advise

## 2020-04-22 ENCOUNTER — Other Ambulatory Visit: Payer: Self-pay | Admitting: Family Medicine

## 2020-05-27 ENCOUNTER — Other Ambulatory Visit: Payer: Self-pay

## 2020-05-27 ENCOUNTER — Ambulatory Visit (INDEPENDENT_AMBULATORY_CARE_PROVIDER_SITE_OTHER): Payer: No Typology Code available for payment source | Admitting: Family Medicine

## 2020-05-27 ENCOUNTER — Encounter: Payer: Self-pay | Admitting: Family Medicine

## 2020-05-27 VITALS — BP 134/94 | HR 96 | Temp 98.0°F | Resp 16 | Ht 64.0 in | Wt 161.0 lb

## 2020-05-27 DIAGNOSIS — I10 Essential (primary) hypertension: Secondary | ICD-10-CM

## 2020-05-27 LAB — CBC WITH DIFFERENTIAL/PLATELET
Basophils Absolute: 0 10*3/uL (ref 0.0–0.1)
Basophils Relative: 0.4 % (ref 0.0–3.0)
Eosinophils Absolute: 0.1 10*3/uL (ref 0.0–0.7)
Eosinophils Relative: 1 % (ref 0.0–5.0)
HCT: 40.2 % (ref 36.0–46.0)
Hemoglobin: 14 g/dL (ref 12.0–15.0)
Lymphocytes Relative: 53.7 % — ABNORMAL HIGH (ref 12.0–46.0)
Lymphs Abs: 2.9 10*3/uL (ref 0.7–4.0)
MCHC: 34.9 g/dL (ref 30.0–36.0)
MCV: 80.2 fl (ref 78.0–100.0)
Monocytes Absolute: 0.3 10*3/uL (ref 0.1–1.0)
Monocytes Relative: 6.4 % (ref 3.0–12.0)
Neutro Abs: 2 10*3/uL (ref 1.4–7.7)
Neutrophils Relative %: 38.5 % — ABNORMAL LOW (ref 43.0–77.0)
Platelets: 324 10*3/uL (ref 150.0–400.0)
RBC: 5.01 Mil/uL (ref 3.87–5.11)
RDW: 14.7 % (ref 11.5–15.5)
WBC: 5.3 10*3/uL (ref 4.0–10.5)

## 2020-05-27 LAB — BASIC METABOLIC PANEL
BUN: 11 mg/dL (ref 6–23)
CO2: 30 mEq/L (ref 19–32)
Calcium: 10.3 mg/dL (ref 8.4–10.5)
Chloride: 101 mEq/L (ref 96–112)
Creatinine, Ser: 0.77 mg/dL (ref 0.40–1.20)
GFR: 94.12 mL/min (ref >60.00)
Glucose, Bld: 88 mg/dL (ref 70–99)
Potassium: 4.4 mEq/L (ref 3.5–5.1)
Sodium: 138 mEq/L (ref 135–145)

## 2020-05-27 MED ORDER — HYDROCHLOROTHIAZIDE 12.5 MG PO TABS: 12.5000 mg | ORAL_TABLET | Freq: Every day | ORAL | 3 refills | Status: DC

## 2020-05-27 NOTE — Assessment & Plan Note (Signed)
Deteriorated. Pt's BP was elevated at Dr Alvira Philips office at post-op visit in April.  It appears this value was pulled forward for her August visit as she reports BP was not taken that day.  Suspect the 175 reading was due to pain after surgery.  Her BP is only mildly elevated today but her diastolic number seems to be regularly around 90 or above.  Will add HCTZ daily to her Valsartan and monitor for improvement.  Pt expressed understanding and is in agreement w/ plan.

## 2020-05-27 NOTE — Progress Notes (Signed)
   Subjective:    Patient ID: Peggy Duncan, female    DOB: 04-17-1966, 54 y.o.   MRN: [id number]  HPI HTN- pt reports BP was elevated at recent appt (174/75 at Dr Harlow Mares office on 8/18 and the same reading on 4/14).  Pt states they didn't repeat her BP at the August appt so she was surprised to hear it was elevated.  BP is 134/94 today.  Currently on Valsartan 80mg  daily.  Pt doesn't check BP at home.  Pt reports she has developed lightheadedness 'just recently'.  Pt says that she has had a lot of stress in the last month.  No CP, SOB, HAs, intermittent blurry vision (had recent eye exam).     Review of Systems For ROS see HPI   This visit occurred during the SARS-CoV-2 public health emergency.  Safety protocols were in place, including screening questions prior to the visit, additional usage of staff PPE, and extensive cleaning of exam room while observing appropriate contact time as indicated for disinfecting solutions.       Objective:   Physical Exam Vitals reviewed.  Constitutional:      General: She is not in acute distress.    Appearance: Normal appearance. She is well-developed.  HENT:     Head: Normocephalic and atraumatic.  Eyes:     Conjunctiva/sclera: Conjunctivae normal.     Pupils: Pupils are equal, round, and reactive to light.  Neck:     Thyroid: No thyromegaly.  Cardiovascular:     Rate and Rhythm: Normal rate and regular rhythm.     Heart sounds: Normal heart sounds. No murmur heard.   Pulmonary:     Effort: Pulmonary effort is normal. No respiratory distress.     Breath sounds: Normal breath sounds.  Abdominal:     General: There is no distension.     Palpations: Abdomen is soft.     Tenderness: There is no abdominal tenderness.  Musculoskeletal:     Cervical back: Normal range of motion and neck supple.  Lymphadenopathy:     Cervical: No cervical adenopathy.  Skin:    General: Skin is warm and dry.  Neurological:     Mental Status: She is alert and  oriented to person, place, and time.  Psychiatric:        Mood and Affect: Mood normal.        Behavior: Behavior normal.           Assessment & Plan:

## 2020-05-27 NOTE — Patient Instructions (Signed)
Follow up in 3-4 weeks to recheck BP We'll notify you of your lab results and make any changes if needed CONTINUE the Valsartan daily ADD the HCTZ once daily You may notice an increase of urination for the first week, but this typically goes back to normal Call with any questions or concerns Stay Safe!  Stay Healthy!

## 2020-05-28 ENCOUNTER — Encounter: Payer: Self-pay | Admitting: General Practice

## 2020-06-21 ENCOUNTER — Ambulatory Visit: Payer: No Typology Code available for payment source | Admitting: Family Medicine

## 2020-07-01 ENCOUNTER — Ambulatory Visit: Payer: No Typology Code available for payment source | Admitting: Family Medicine

## 2020-07-12 ENCOUNTER — Encounter: Payer: Self-pay | Admitting: Family Medicine

## 2020-07-12 ENCOUNTER — Ambulatory Visit: Payer: No Typology Code available for payment source | Admitting: Family Medicine

## 2020-07-12 ENCOUNTER — Other Ambulatory Visit: Payer: Self-pay

## 2020-07-12 VITALS — BP 124/83 | HR 84 | Temp 97.9°F | Resp 16 | Ht 64.0 in | Wt 161.5 lb

## 2020-07-12 DIAGNOSIS — Z23 Encounter for immunization: Secondary | ICD-10-CM

## 2020-07-12 DIAGNOSIS — I10 Essential (primary) hypertension: Secondary | ICD-10-CM | POA: Diagnosis not present

## 2020-07-12 LAB — BASIC METABOLIC PANEL
BUN: 10 mg/dL (ref 6–23)
CO2: 31 mEq/L (ref 19–32)
Calcium: 10.1 mg/dL (ref 8.4–10.5)
Chloride: 100 mEq/L (ref 96–112)
Creatinine, Ser: 0.85 mg/dL (ref 0.40–1.20)
GFR: 76.98 mL/min (ref >60.00)
Glucose, Bld: 82 mg/dL (ref 70–99)
Potassium: 3.9 mEq/L (ref 3.5–5.1)
Sodium: 138 mEq/L (ref 135–145)

## 2020-07-12 NOTE — Patient Instructions (Signed)
Schedule your complete physical in 6 months We'll notify you of your lab results and make any changes if needed Keep up the good work on healthy diet and regular exercise- you can do it! Call with any questions or concerns Stay Safe!  Stay Healthy!

## 2020-07-12 NOTE — Progress Notes (Signed)
   Subjective:    Patient ID: Annlouise Gerety, female    DOB: 12/25/1965, 54 y.o.   MRN: [id number]  HPI HTN- chronic problem, on Valsartan 80mg  daily and HCTZ 12.5mg  was added at last visit.  BP is much better today at 124/83.  Pt reports feeling better since BP has improved.  No CP, SOB, HAs, visual changes, edema.  No longer feeling lightheaded   Review of Systems For ROS see HPI   This visit occurred during the SARS-CoV-2 public health emergency.  Safety protocols were in place, including screening questions prior to the visit, additional usage of staff PPE, and extensive cleaning of exam room while observing appropriate contact time as indicated for disinfecting solutions.       Objective:   Physical Exam Vitals reviewed.  Constitutional:      General: She is not in acute distress.    Appearance: Normal appearance. She is well-developed.  HENT:     Head: Normocephalic and atraumatic.  Eyes:     Conjunctiva/sclera: Conjunctivae normal.     Pupils: Pupils are equal, round, and reactive to light.  Neck:     Thyroid: No thyromegaly.  Cardiovascular:     Rate and Rhythm: Normal rate and regular rhythm.     Heart sounds: Normal heart sounds. No murmur heard.   Pulmonary:     Effort: Pulmonary effort is normal. No respiratory distress.     Breath sounds: Normal breath sounds.  Abdominal:     General: There is no distension.     Palpations: Abdomen is soft.     Tenderness: There is no abdominal tenderness.  Musculoskeletal:     Cervical back: Normal range of motion and neck supple.     Right lower leg: No edema.     Left lower leg: No edema.  Lymphadenopathy:     Cervical: No cervical adenopathy.  Skin:    General: Skin is warm and dry.  Neurological:     Mental Status: She is alert and oriented to person, place, and time.  Psychiatric:        Behavior: Behavior normal.           Assessment & Plan:

## 2020-07-12 NOTE — Assessment & Plan Note (Signed)
Much improved since adding HCTZ 12.5mg  daily to the Valsartan 80mg  daily.  Pt reports overall feeling better.  Check BMP due to addition of diuretic.  Will follow.

## 2020-07-15 ENCOUNTER — Encounter: Payer: Self-pay | Admitting: General Practice

## 2020-07-22 ENCOUNTER — Other Ambulatory Visit: Payer: Self-pay | Admitting: Family Medicine

## 2020-08-09 ENCOUNTER — Other Ambulatory Visit: Payer: Self-pay | Admitting: Family Medicine

## 2020-10-31 ENCOUNTER — Other Ambulatory Visit: Payer: Self-pay | Admitting: Family Medicine

## 2021-01-26 ENCOUNTER — Other Ambulatory Visit: Payer: Self-pay | Admitting: Family Medicine

## 2021-01-29 ENCOUNTER — Other Ambulatory Visit: Payer: Self-pay | Admitting: Family Medicine

## 2021-02-22 ENCOUNTER — Other Ambulatory Visit: Payer: Self-pay | Admitting: Family Medicine

## 2021-03-13 ENCOUNTER — Ambulatory Visit: Payer: No Typology Code available for payment source | Admitting: Family Medicine

## 2021-03-13 ENCOUNTER — Telehealth: Payer: Self-pay | Admitting: Family Medicine

## 2021-03-13 MED ORDER — VALSARTAN 80 MG PO TABS: 80.0000 mg | ORAL_TABLET | Freq: Every day | ORAL | 0 refills | Status: DC

## 2021-03-13 NOTE — Telephone Encounter (Signed)
Prescription sent

## 2021-03-13 NOTE — Telephone Encounter (Signed)
Pt needs a new script of the valsartan sent to the [facility] on [address]

## 2021-03-19 ENCOUNTER — Ambulatory Visit: Payer: No Typology Code available for payment source | Admitting: Family Medicine

## 2021-03-20 ENCOUNTER — Ambulatory Visit: Payer: No Typology Code available for payment source | Admitting: Family Medicine

## 2021-03-25 ENCOUNTER — Other Ambulatory Visit: Payer: Self-pay

## 2021-03-25 ENCOUNTER — Ambulatory Visit: Payer: No Typology Code available for payment source | Admitting: Family Medicine

## 2021-03-25 ENCOUNTER — Encounter: Payer: Self-pay | Admitting: Family Medicine

## 2021-03-25 VITALS — BP 130/84 | HR 75 | Temp 97.9°F | Resp 19 | Ht 64.0 in | Wt 159.2 lb

## 2021-03-25 DIAGNOSIS — I10 Essential (primary) hypertension: Secondary | ICD-10-CM

## 2021-03-25 DIAGNOSIS — Z1211 Encounter for screening for malignant neoplasm of colon: Secondary | ICD-10-CM | POA: Diagnosis not present

## 2021-03-25 DIAGNOSIS — Z0000 Encounter for general adult medical examination without abnormal findings: Secondary | ICD-10-CM | POA: Diagnosis not present

## 2021-03-25 DIAGNOSIS — E559 Vitamin D deficiency, unspecified: Secondary | ICD-10-CM | POA: Diagnosis not present

## 2021-03-25 LAB — CBC WITH DIFFERENTIAL/PLATELET
Basophils Absolute: 0 10*3/uL (ref 0.0–0.1)
Basophils Relative: 0.9 % (ref 0.0–3.0)
Eosinophils Absolute: 0.1 10*3/uL (ref 0.0–0.7)
Eosinophils Relative: 1.3 % (ref 0.0–5.0)
HCT: 38.3 % (ref 36.0–46.0)
Hemoglobin: 13.7 g/dL (ref 12.0–15.0)
Lymphocytes Relative: 48.6 % — ABNORMAL HIGH (ref 12.0–46.0)
Lymphs Abs: 2.4 10*3/uL (ref 0.7–4.0)
MCHC: 35.9 g/dL (ref 30.0–36.0)
MCV: 79.6 fl (ref 78.0–100.0)
Monocytes Absolute: 0.3 10*3/uL (ref 0.1–1.0)
Monocytes Relative: 6 % (ref 3.0–12.0)
Neutro Abs: 2.1 10*3/uL (ref 1.4–7.7)
Neutrophils Relative %: 43.2 % (ref 43.0–77.0)
Platelets: 310 10*3/uL (ref 150.0–400.0)
RBC: 4.82 Mil/uL (ref 3.87–5.11)
RDW: 13.8 % (ref 11.5–15.5)
WBC: 4.8 10*3/uL (ref 4.0–10.5)

## 2021-03-25 LAB — BASIC METABOLIC PANEL
BUN: 14 mg/dL (ref 6–23)
CO2: 30 mEq/L (ref 19–32)
Calcium: 10 mg/dL (ref 8.4–10.5)
Chloride: 100 mEq/L (ref 96–112)
Creatinine, Ser: 0.77 mg/dL (ref 0.40–1.20)
GFR: 86.5 mL/min (ref >60.00)
Glucose, Bld: 85 mg/dL (ref 70–99)
Potassium: 3.7 mEq/L (ref 3.5–5.1)
Sodium: 138 mEq/L (ref 135–145)

## 2021-03-25 LAB — LIPID PANEL
Cholesterol: 214 mg/dL — ABNORMAL HIGH (ref 0–200)
HDL: 50.8 mg/dL (ref >39.00)
LDL Cholesterol: 142 mg/dL — ABNORMAL HIGH (ref 0–99)
NonHDL: 163.09
Total CHOL/HDL Ratio: 4
Triglycerides: 103 mg/dL (ref 0.0–149.0)
VLDL: 20.6 mg/dL (ref 0.0–40.0)

## 2021-03-25 LAB — HEPATIC FUNCTION PANEL
ALT: 27 U/L (ref 0–35)
AST: 28 U/L (ref 0–37)
Albumin: 4.5 g/dL (ref 3.5–5.2)
Alkaline Phosphatase: 85 U/L (ref 39–117)
Bilirubin, Direct: 0.1 mg/dL (ref 0.0–0.3)
Total Bilirubin: 0.8 mg/dL (ref 0.2–1.2)
Total Protein: 7.8 g/dL (ref 6.0–8.3)

## 2021-03-25 LAB — TSH: TSH: 1.13 u[IU]/mL (ref 0.35–4.50)

## 2021-03-25 LAB — VITAMIN D 25 HYDROXY (VIT D DEFICIENCY, FRACTURES): VITD: 27.44 ng/mL — ABNORMAL LOW (ref 30.00–100.00)

## 2021-03-25 MED ORDER — HYDROCHLOROTHIAZIDE 12.5 MG PO TABS: 12.5000 mg | ORAL_TABLET | Freq: Every day | ORAL | 1 refills | Status: DC

## 2021-03-25 NOTE — Progress Notes (Signed)
   Subjective:    Patient ID: Peggy Duncan, female    DOB: 07/14/1966, 55 y.o.   MRN: [id number]  HPI CPE- due for mammo (pt plans to schedule), colonoscopy.  UTD on pap.  UTD on Tdap, flu, COVID (pt reports)  Reviewed past medical, surgical, family and social histories.   Patient Care Team    Relationship Specialty Notifications Start End  Midge Minium, MD PCP - General   09/10/10   Sheldon Silvan., MD Referring Physician Obstetrics and Gynecology  12/05/15     Health Maintenance  Topic Date Due   COVID-19 Vaccine (1) Never done   COLONOSCOPY (Pts 45-33yrs Insurance coverage will need to be confirmed)  Never done   Zoster Vaccines- Shingrix (1 of 2) Never done   MAMMOGRAM  08/01/2020   Hepatitis C Screening  05/27/2021 (Originally 03/31/1983)   HIV Screening  05/27/2021 (Originally 03/30/1980)   INFLUENZA VACCINE  04/28/2021   PAP SMEAR-Modifier  10/13/2021   TETANUS/TDAP  12/04/2025   Pneumococcal Vaccine 34-47 Years old  Aged Out   HPV VACCINES  Aged Out      Review of Systems Patient reports no vision/ hearing changes, adenopathy,fever, weight change,  persistant/recurrent hoarseness , swallowing issues, chest pain, palpitations, edema, persistant/recurrent cough, hemoptysis, dyspnea (rest/exertional/paroxysmal nocturnal), gastrointestinal bleeding (melena, rectal bleeding), abdominal pain, significant heartburn, bowel changes, GU symptoms (dysuria, hematuria, incontinence), Gyn symptoms (abnormal  bleeding, pain),  syncope, focal weakness, memory loss, numbness & tingling, skin/hair/nail changes, abnormal bruising or bleeding, anxiety, or depression.   This visit occurred during the SARS-CoV-2 public health emergency.  Safety protocols were in place, including screening questions prior to the visit, additional usage of staff PPE, and extensive cleaning of exam room while observing appropriate contact time as indicated for disinfecting solutions.      Objective:   Physical  Exam General Appearance:    Alert, cooperative, no distress, appears stated age  Head:    Normocephalic, without obvious abnormality, atraumatic  Eyes:    PERRL, conjunctiva/corneas clear, EOM's intact, fundi    benign, both eyes  Ears:    Normal TM's and external ear canals, both ears  Nose:   Deferred due to COVID  Throat:   Neck:   Supple, symmetrical, trachea midline, no adenopathy;    Thyroid: no enlargement/tenderness/nodules  Back:     Symmetric, no curvature, ROM normal, no CVA tenderness  Lungs:     Clear to auscultation bilaterally, respirations unlabored  Chest Wall:    No tenderness or deformity   Heart:    Regular rate and rhythm, S1 and S2 normal, no murmur, rub   or gallop  Breast Exam:    Deferred to GYN  Abdomen:     Soft, non-tender, bowel sounds active all four quadrants,    no masses, no organomegaly  Genitalia:    Deferred to GYN  Rectal:    Extremities:   Extremities normal, atraumatic, no cyanosis or edema  Pulses:   2+ and symmetric all extremities  Skin:   Skin color, texture, turgor normal, no rashes or lesions  Lymph nodes:   Cervical, supraclavicular, and axillary nodes normal  Neurologic:   CNII-XII intact, normal strength, sensation and reflexes    throughout          Assessment & Plan:

## 2021-03-25 NOTE — Assessment & Plan Note (Signed)
Pt's PE WNL.  UTD on pap, Tdap, flu, COVID.  She plans to schedule mammo.  Referral placed for colonoscopy.  Check labs.  Anticipatory guidance provided.

## 2021-03-25 NOTE — Patient Instructions (Signed)
Follow up in 6 months to recheck BP We'll notify you of your lab results and make any changes if needed Continue to work on healthy diet and regular exercise- you look great! Call and schedule your mammogram! We'll call you with your GI appt for the colonoscopy consultation Call with any questions or concerns Happy Birthday!!!

## 2021-03-25 NOTE — Assessment & Plan Note (Signed)
Pt has hx of this.  Check labs and replete prn. 

## 2021-03-25 NOTE — Assessment & Plan Note (Signed)
Chronic problem.  Typically well controlled on Valsartan and HCTZ.  BP initially elevated as pt admits she gets 'very nervous' for appts.  BP on recheck was 130/84.  Check labs.  No anticipated med changes.  Will follow.

## 2021-03-26 ENCOUNTER — Encounter: Payer: Self-pay | Admitting: *Deleted

## 2021-03-26 ENCOUNTER — Other Ambulatory Visit: Payer: Self-pay

## 2021-03-26 DIAGNOSIS — E559 Vitamin D deficiency, unspecified: Secondary | ICD-10-CM

## 2021-03-26 MED ORDER — VITAMIN D (ERGOCALCIFEROL) 1.25 MG (50000 UNIT) PO CAPS: 50000.0000 [IU] | ORAL_CAPSULE | ORAL | 0 refills | Status: DC

## 2021-06-08 ENCOUNTER — Other Ambulatory Visit: Payer: Self-pay | Admitting: Family Medicine

## 2021-09-05 ENCOUNTER — Other Ambulatory Visit: Payer: Self-pay | Admitting: Family Medicine

## 2021-09-06 ENCOUNTER — Other Ambulatory Visit: Payer: Self-pay | Admitting: Family Medicine

## 2021-11-19 ENCOUNTER — Other Ambulatory Visit: Payer: Self-pay | Admitting: Family Medicine

## 2022-02-23 ENCOUNTER — Other Ambulatory Visit: Payer: Self-pay | Admitting: Family Medicine

## 2022-03-04 ENCOUNTER — Other Ambulatory Visit: Payer: Self-pay | Admitting: Family Medicine

## 2022-05-28 ENCOUNTER — Other Ambulatory Visit: Payer: Self-pay

## 2022-05-28 ENCOUNTER — Other Ambulatory Visit: Payer: Self-pay | Admitting: Family Medicine

## 2022-05-28 MED ORDER — VALSARTAN 80 MG PO TABS: 80.0000 mg | ORAL_TABLET | Freq: Every day | ORAL | 0 refills | Status: DC

## 2022-06-10 ENCOUNTER — Other Ambulatory Visit: Payer: Self-pay | Admitting: Family Medicine

## 2022-07-27 ENCOUNTER — Other Ambulatory Visit: Payer: Self-pay | Admitting: Family Medicine

## 2022-09-02 ENCOUNTER — Other Ambulatory Visit: Payer: Self-pay | Admitting: Family Medicine

## 2022-09-08 ENCOUNTER — Telehealth: Payer: Self-pay | Admitting: Family Medicine

## 2022-09-08 NOTE — Telephone Encounter (Signed)
Caller name: Rami Waddle  On DPR?: Yes  Call back number: [phone] (mobile)  Provider they see: Midge Minium, MD  Reason for call: Patient called to speak with someone about her concerns with her medication (valsartan 80 mg).

## 2022-09-09 NOTE — Telephone Encounter (Signed)
Patient called back to speak with Peggy Duncan

## 2022-09-09 NOTE — Telephone Encounter (Signed)
Called patient to discuss her concern, no answer LM to call back to collect information about her concerns

## 2022-09-09 NOTE — Telephone Encounter (Signed)
Pt concerned due to class action lawsuit against valsartan, notes she got a letter regarding this, wants to ensure this is safe or if she needs to change medication, she is bringing this letter to appt tomorrow

## 2022-09-09 NOTE — Telephone Encounter (Signed)
Spoke to the pt and advised her of Dr Birdie Riddle message

## 2022-09-09 NOTE — Telephone Encounter (Signed)
There are class action lawsuits regarding contaminated batches of Valsartan from 2012- 2018.  These batches were recalled and pulled off the market.  So the medication that she is taking should be safe as the pharmacies do not carry these products

## 2022-09-09 NOTE — Telephone Encounter (Signed)
See below

## 2022-09-10 ENCOUNTER — Other Ambulatory Visit: Payer: Self-pay

## 2022-09-10 ENCOUNTER — Encounter: Payer: Self-pay | Admitting: Family Medicine

## 2022-09-10 ENCOUNTER — Ambulatory Visit: Payer: No Typology Code available for payment source | Admitting: Family Medicine

## 2022-09-10 ENCOUNTER — Telehealth: Payer: Self-pay

## 2022-09-10 VITALS — BP 126/82 | HR 84 | Temp 97.0°F | Resp 16 | Ht 64.0 in | Wt 160.1 lb

## 2022-09-10 DIAGNOSIS — Z23 Encounter for immunization: Secondary | ICD-10-CM | POA: Diagnosis not present

## 2022-09-10 DIAGNOSIS — I10 Essential (primary) hypertension: Secondary | ICD-10-CM

## 2022-09-10 DIAGNOSIS — E559 Vitamin D deficiency, unspecified: Secondary | ICD-10-CM | POA: Diagnosis not present

## 2022-09-10 DIAGNOSIS — E663 Overweight: Secondary | ICD-10-CM | POA: Diagnosis not present

## 2022-09-10 DIAGNOSIS — Z1211 Encounter for screening for malignant neoplasm of colon: Secondary | ICD-10-CM

## 2022-09-10 DIAGNOSIS — R7989 Other specified abnormal findings of blood chemistry: Secondary | ICD-10-CM

## 2022-09-10 LAB — CBC WITH DIFFERENTIAL/PLATELET
Basophils Absolute: 0 10*3/uL (ref 0.0–0.1)
Basophils Relative: 0.6 % (ref 0.0–3.0)
Eosinophils Absolute: 0 10*3/uL (ref 0.0–0.7)
Eosinophils Relative: 0.8 % (ref 0.0–5.0)
HCT: 39.4 % (ref 36.0–46.0)
Hemoglobin: 13.9 g/dL (ref 12.0–15.0)
Lymphocytes Relative: 46.5 % — ABNORMAL HIGH (ref 12.0–46.0)
Lymphs Abs: 2.5 10*3/uL (ref 0.7–4.0)
MCHC: 35.2 g/dL (ref 30.0–36.0)
MCV: 80 fl (ref 78.0–100.0)
Monocytes Absolute: 0.3 10*3/uL (ref 0.1–1.0)
Monocytes Relative: 5.1 % (ref 3.0–12.0)
Neutro Abs: 2.5 10*3/uL (ref 1.4–7.7)
Neutrophils Relative %: 47 % (ref 43.0–77.0)
Platelets: 328 10*3/uL (ref 150.0–400.0)
RBC: 4.93 Mil/uL (ref 3.87–5.11)
RDW: 13.7 % (ref 11.5–15.5)
WBC: 5.4 10*3/uL (ref 4.0–10.5)

## 2022-09-10 LAB — HEPATIC FUNCTION PANEL
ALT: 18 U/L (ref 0–35)
AST: 23 U/L (ref 0–37)
Albumin: 4.3 g/dL (ref 3.5–5.2)
Alkaline Phosphatase: 86 U/L (ref 39–117)
Bilirubin, Direct: 0.1 mg/dL (ref 0.0–0.3)
Total Bilirubin: 0.7 mg/dL (ref 0.2–1.2)
Total Protein: 7.8 g/dL (ref 6.0–8.3)

## 2022-09-10 LAB — LIPID PANEL
Cholesterol: 202 mg/dL — ABNORMAL HIGH (ref 0–200)
HDL: 51.1 mg/dL (ref >39.00)
LDL Cholesterol: 133 mg/dL — ABNORMAL HIGH (ref 0–99)
NonHDL: 150.77
Total CHOL/HDL Ratio: 4
Triglycerides: 89 mg/dL (ref 0.0–149.0)
VLDL: 17.8 mg/dL (ref 0.0–40.0)

## 2022-09-10 LAB — BASIC METABOLIC PANEL
BUN: 10 mg/dL (ref 6–23)
CO2: 31 mEq/L (ref 19–32)
Calcium: 9.8 mg/dL (ref 8.4–10.5)
Chloride: 102 mEq/L (ref 96–112)
Creatinine, Ser: 0.85 mg/dL (ref 0.40–1.20)
GFR: 76.04 mL/min (ref >60.00)
Glucose, Bld: 97 mg/dL (ref 70–99)
Potassium: 3.8 mEq/L (ref 3.5–5.1)
Sodium: 140 mEq/L (ref 135–145)

## 2022-09-10 LAB — VITAMIN D 25 HYDROXY (VIT D DEFICIENCY, FRACTURES): VITD: 26.75 ng/mL — ABNORMAL LOW (ref 30.00–100.00)

## 2022-09-10 LAB — TSH: TSH: 0.98 u[IU]/mL (ref 0.35–5.50)

## 2022-09-10 MED ORDER — VITAMIN D (ERGOCALCIFEROL) 1.25 MG (50000 UNIT) PO CAPS: 50000.0000 [IU] | ORAL_CAPSULE | ORAL | 12 refills | Status: DC

## 2022-09-10 NOTE — Progress Notes (Signed)
   Subjective:    Patient ID: Peggy Duncan, female    DOB: [birth year], 56 y.o.   MRN: [id number]  HPI HTN- chronic problem, on Valsartan '80mg'$  daily and HCTZ 12.'5mg'$  daily w/ adequate control.  No CP, SOB, HA's, visual changes, edema.  Overweight- pt's weight is stable since last visit.  Pt is exercising 3-4x/week- incline walk, weight lifting.  Vit D deficiency- due for repeat labs  Health Maintenance- due for mammo, pap [facility]).  Due for colonoscopy.   Review of Systems For ROS see HPI     Objective:   Physical Exam Vitals reviewed.  Constitutional:      General: She is not in acute distress.    Appearance: Normal appearance. She is well-developed. She is not ill-appearing.  HENT:     Head: Normocephalic and atraumatic.  Eyes:     Conjunctiva/sclera: Conjunctivae normal.     Pupils: Pupils are equal, round, and reactive to light.  Neck:     Thyroid: No thyromegaly.  Cardiovascular:     Rate and Rhythm: Normal rate and regular rhythm.     Pulses: Normal pulses.     Heart sounds: Normal heart sounds. No murmur heard. Pulmonary:     Effort: Pulmonary effort is normal. No respiratory distress.     Breath sounds: Normal breath sounds.  Abdominal:     General: There is no distension.     Palpations: Abdomen is soft.     Tenderness: There is no abdominal tenderness.  Musculoskeletal:     Cervical back: Normal range of motion and neck supple.     Right lower leg: No edema.     Left lower leg: No edema.  Lymphadenopathy:     Cervical: No cervical adenopathy.  Skin:    General: Skin is warm and dry.  Neurological:     General: No focal deficit present.     Mental Status: She is alert and oriented to person, place, and time.  Psychiatric:        Mood and Affect: Mood normal.        Behavior: Behavior normal.           Assessment & Plan:

## 2022-09-10 NOTE — Assessment & Plan Note (Signed)
Chronic problem.  Currently on Valsartan '80mg'$  daily and HCTZ 12.'5mg'$  daily w/ good control.  Asymptomatic.  Check labs due to ARB and diuretic.  No anticipated med changes.

## 2022-09-10 NOTE — Assessment & Plan Note (Signed)
Ongoing issue.  Weight is stable at 160 lbs.  BMI 27.49  Pt is exercising regularly.  Encouraged low carb, low sugar diet.  Check labs to risk stratify.  Will follow.

## 2022-09-10 NOTE — Telephone Encounter (Signed)
-----   Message from Midge Minium, MD sent at 09/10/2022  3:27 PM EST ----- Labs look great w/ exception of low Vit D.  Based on this, we need to start 50,000 units weekly x12 weeks in addition to daily OTC supplement of at least 2000 units.

## 2022-09-10 NOTE — Telephone Encounter (Signed)
Left lab results on pt VM and Vitamin d 50,000 units has been sent to pt pharmacy

## 2022-09-10 NOTE — Patient Instructions (Signed)
Schedule your complete physical in 6 months We'll notify you of your lab results and make any changes if needed Keep up the good work on healthy diet and regular exercise- you're doing great!! Call and schedule your pap and mammo We'll call you to schedule your colonoscopy Call with any questions or concerns Stay Safe!  Stay Healthy! Happy Holidays!!!

## 2022-09-10 NOTE — Assessment & Plan Note (Signed)
Check labs and replete prn. 

## 2022-10-07 ENCOUNTER — Other Ambulatory Visit: Payer: Self-pay | Admitting: Family Medicine

## 2022-12-24 ENCOUNTER — Other Ambulatory Visit: Payer: Self-pay | Admitting: Family Medicine

## 2023-03-12 ENCOUNTER — Encounter: Payer: No Typology Code available for payment source | Admitting: Family Medicine

## 2023-03-25 ENCOUNTER — Encounter: Payer: Self-pay | Admitting: Family Medicine

## 2023-03-25 ENCOUNTER — Ambulatory Visit (INDEPENDENT_AMBULATORY_CARE_PROVIDER_SITE_OTHER): Payer: No Typology Code available for payment source | Admitting: Family Medicine

## 2023-03-25 VITALS — BP 136/82 | HR 78 | Temp 98.0°F | Resp 18 | Ht 64.0 in | Wt 157.4 lb

## 2023-03-25 DIAGNOSIS — Z1211 Encounter for screening for malignant neoplasm of colon: Secondary | ICD-10-CM | POA: Diagnosis not present

## 2023-03-25 DIAGNOSIS — E663 Overweight: Secondary | ICD-10-CM | POA: Diagnosis not present

## 2023-03-25 DIAGNOSIS — E559 Vitamin D deficiency, unspecified: Secondary | ICD-10-CM

## 2023-03-25 DIAGNOSIS — Z0000 Encounter for general adult medical examination without abnormal findings: Secondary | ICD-10-CM

## 2023-03-25 MED ORDER — HYDROCHLOROTHIAZIDE 12.5 MG PO TABS: 12.5000 mg | ORAL_TABLET | Freq: Every day | ORAL | 1 refills | Status: DC

## 2023-03-25 NOTE — Progress Notes (Signed)
   Subjective:    Patient ID: Peggy Duncan, female    DOB: 1966-05-29, 57 y.o.   MRN: [id number]  HPI CPE- due for pap, mammo, colonoscopy.  UTD on Tdap.  Patient Care Team    Relationship Specialty Notifications Start End  Sheliah Hatch, MD PCP - General   09/10/10   Lynnea Ferrier., MD Referring Physician Obstetrics and Gynecology  12/05/15     Health Maintenance  Topic Date Due  . Colonoscopy  Never done  . MAMMOGRAM  08/01/2020  . PAP SMEAR-Modifier  10/13/2021  . Zoster Vaccines- Shingrix (1 of 2) 06/25/2023 (Originally 03/31/2015)  . INFLUENZA VACCINE  04/29/2023  . DTaP/Tdap/Td (2 - Td or Tdap) 12/04/2025  . HPV VACCINES  Aged Out  . COVID-19 Vaccine  Discontinued  . Hepatitis C Screening  Discontinued  . HIV Screening  Discontinued     Review of Systems Patient reports no vision/ hearing changes, adenopathy,fever, weight change,  persistant/recurrent hoarseness , swallowing issues, chest pain, palpitations, edema, persistant/recurrent cough, hemoptysis, dyspnea (rest/exertional/paroxysmal nocturnal), gastrointestinal bleeding (melena, rectal bleeding), abdominal pain, significant heartburn, bowel changes, GU symptoms (dysuria, hematuria, incontinence), Gyn symptoms (abnormal  bleeding, pain),  syncope, focal weakness, memory loss, numbness & tingling, skin/hair/nail changes, abnormal bruising or bleeding, anxiety, or depression.     Objective:   Physical Exam General Appearance:    Alert, cooperative, no distress, appears stated age  Head:    Normocephalic, without obvious abnormality, atraumatic  Eyes:    PERRL, conjunctiva/corneas clear, EOM's intact both eyes  Ears:    Normal TM's and external ear canals, both ears  Nose:   Nares normal, septum midline, mucosa normal, no drainage    or sinus tenderness  Throat:   Lips, mucosa, and tongue normal; teeth and gums normal  Neck:   Supple, symmetrical, trachea midline, no adenopathy;    Thyroid: no  enlargement/tenderness/nodules  Back:     Symmetric, no curvature, ROM normal, no CVA tenderness  Lungs:     Clear to auscultation bilaterally, respirations unlabored  Chest Wall:    No tenderness or deformity   Heart:    Regular rate and rhythm, S1 and S2 normal, no murmur, rub   or gallop  Breast Exam:    Deferred to GYN  Abdomen:     Soft, non-tender, bowel sounds active all four quadrants,    no masses, no organomegaly  Genitalia:    Deferred to GYN  Rectal:    Extremities:   Extremities normal, atraumatic, no cyanosis or edema  Pulses:   2+ and symmetric all extremities  Skin:   Skin color, texture, turgor normal, no rashes or lesions  Lymph nodes:   Cervical, supraclavicular, and axillary nodes normal  Neurologic:   CNII-XII intact, normal strength, sensation and reflexes    throughout          Assessment & Plan:

## 2023-03-25 NOTE — Patient Instructions (Signed)
Follow up in 6 months to recheck BP We'll notify you of your lab results and make any changes if needed Call GYN and schedule your pap and mammo Complete and return the Cologuard as directed Keep up the good work!  You look great!!! Call with any questions or concerns HAPPY BIRTHDAY!!!

## 2023-03-26 LAB — CBC WITH DIFFERENTIAL/PLATELET
Basophils Absolute: 0.1 10*3/uL (ref 0.0–0.1)
Basophils Relative: 1.2 % (ref 0.0–3.0)
Eosinophils Absolute: 0.1 10*3/uL (ref 0.0–0.7)
Eosinophils Relative: 1.2 % (ref 0.0–5.0)
HCT: 42.4 % (ref 36.0–46.0)
Hemoglobin: 14.5 g/dL (ref 12.0–15.0)
Lymphocytes Relative: 52.4 % — ABNORMAL HIGH (ref 12.0–46.0)
Lymphs Abs: 3 10*3/uL (ref 0.7–4.0)
MCHC: 34.1 g/dL (ref 30.0–36.0)
MCV: 80.9 fl (ref 78.0–100.0)
Monocytes Absolute: 0.3 10*3/uL (ref 0.1–1.0)
Monocytes Relative: 5.5 % (ref 3.0–12.0)
Neutro Abs: 2.3 10*3/uL (ref 1.4–7.7)
Neutrophils Relative %: 39.7 % — ABNORMAL LOW (ref 43.0–77.0)
Platelets: 331 10*3/uL (ref 150.0–400.0)
RBC: 5.24 Mil/uL — ABNORMAL HIGH (ref 3.87–5.11)
RDW: 14 % (ref 11.5–15.5)
WBC: 5.8 10*3/uL (ref 4.0–10.5)

## 2023-03-26 LAB — BASIC METABOLIC PANEL
BUN: 11 mg/dL (ref 6–23)
CO2: 29 mEq/L (ref 19–32)
Calcium: 10.7 mg/dL — ABNORMAL HIGH (ref 8.4–10.5)
Chloride: 99 mEq/L (ref 96–112)
Creatinine, Ser: 0.83 mg/dL (ref 0.40–1.20)
GFR: 77.95 mL/min (ref >60.00)
Glucose, Bld: 73 mg/dL (ref 70–99)
Potassium: 3.9 mEq/L (ref 3.5–5.1)
Sodium: 139 mEq/L (ref 135–145)

## 2023-03-26 LAB — HEPATIC FUNCTION PANEL
ALT: 21 U/L (ref 0–35)
AST: 27 U/L (ref 0–37)
Albumin: 4.6 g/dL (ref 3.5–5.2)
Alkaline Phosphatase: 80 U/L (ref 39–117)
Bilirubin, Direct: 0.1 mg/dL (ref 0.0–0.3)
Total Bilirubin: 0.8 mg/dL (ref 0.2–1.2)
Total Protein: 8.4 g/dL — ABNORMAL HIGH (ref 6.0–8.3)

## 2023-03-26 LAB — VITAMIN D 25 HYDROXY (VIT D DEFICIENCY, FRACTURES): VITD: 23.2 ng/mL — ABNORMAL LOW (ref 30.00–100.00)

## 2023-03-26 LAB — LIPID PANEL
Cholesterol: 231 mg/dL — ABNORMAL HIGH (ref 0–200)
HDL: 49.1 mg/dL (ref >39.00)
LDL Cholesterol: 144 mg/dL — ABNORMAL HIGH (ref 0–99)
NonHDL: 182.21
Total CHOL/HDL Ratio: 5
Triglycerides: 192 mg/dL — ABNORMAL HIGH (ref 0.0–149.0)
VLDL: 38.4 mg/dL (ref 0.0–40.0)

## 2023-03-26 LAB — TSH: TSH: 1.12 u[IU]/mL (ref 0.35–5.50)

## 2023-03-26 NOTE — Assessment & Plan Note (Signed)
Check labs and replete prn. 

## 2023-03-26 NOTE — Assessment & Plan Note (Signed)
Encouraged low carb diet and regular exercise.  Check labs to risk stratify. 

## 2023-03-26 NOTE — Assessment & Plan Note (Signed)
Pt's PE WNL w/ exception of BMI.  Due for pap and mammo- pt to schedule.  Cologuard ordered.  UTD on Tdap.  Check labs.  Anticipatory guidance provided.

## 2023-04-07 ENCOUNTER — Encounter: Payer: Self-pay | Admitting: Family Medicine

## 2023-07-01 ENCOUNTER — Encounter: Payer: Self-pay | Admitting: Family Medicine

## 2023-07-01 ENCOUNTER — Ambulatory Visit: Payer: No Typology Code available for payment source | Admitting: Family Medicine

## 2023-07-01 VITALS — BP 144/82 | HR 96 | Temp 97.9°F | Wt 155.8 lb

## 2023-07-01 DIAGNOSIS — H6122 Impacted cerumen, left ear: Secondary | ICD-10-CM | POA: Diagnosis not present

## 2023-07-01 DIAGNOSIS — Z23 Encounter for immunization: Secondary | ICD-10-CM

## 2023-07-01 DIAGNOSIS — I10 Essential (primary) hypertension: Secondary | ICD-10-CM

## 2023-07-01 DIAGNOSIS — R42 Dizziness and giddiness: Secondary | ICD-10-CM

## 2023-07-01 NOTE — Patient Instructions (Signed)
Follow up as needed or as scheduled Saturate a cotton ball w/ peroxide and drip it into the ear daily to remove the wax Do the exercises to help w/ your dizziness Increase your water intake A daily allergy medication like Claritin or Zyrtec can also help the dizziness Call with any questions or concerns Stay Safe!  Stay Healthy! Hang in there!

## 2023-07-01 NOTE — Assessment & Plan Note (Signed)
Elevated today but pt reports BP at the gym has been normal- ranging 120-130s/80s.  She was worried this was causing her dizziness but her description of dizziness w/ turning her head is more consistent w/ vertigo.  Encouraged her to continue hydrochlorothiazide and Valsartan while monitoring BP.  If BP remains high, will adjust medication.  Pt expressed understanding and is in agreement w/ plan.

## 2023-07-01 NOTE — Progress Notes (Signed)
   Subjective:    Patient ID: Peggy Duncan, female    DOB: [birth year], 57 y.o.   MRN: [id number]  HPI Dizziness- sxs started ~2 weeks ago.  Worse w/ turning her head.  No nausea.  Pt reports feeling off balance.  No ear pain.  + mild nasal congestion.  HTN- 'blood pressure has been all over the place'.  Pt reports increased stress recently.  Currently on hydrochlorothiazide 12.5mg  and Valsartan 80mg  daily.  Pt reports BP at gym has been in the 120s-130s/80s.  Denies CP, SOB, visual changes, edema.   Review of Systems For ROS see HPI     Objective:   Physical Exam Vitals reviewed.  Constitutional:      General: She is not in acute distress.    Appearance: She is well-developed.  HENT:     Head: Normocephalic and atraumatic.     Right Ear: Hearing and tympanic membrane normal.     Left Ear: There is impacted cerumen.     Ears:     Comments: Pt consented to wax removal w/ irrigation and curette.  Had a difficult time tolerating procedure so this was aborted and wax remains Eyes:     Extraocular Movements:     Right eye: Normal extraocular motion and no nystagmus.     Left eye: Normal extraocular motion and no nystagmus.  Cardiovascular:     Rate and Rhythm: Normal rate and regular rhythm.  Pulmonary:     Effort: Pulmonary effort is normal. No respiratory distress.     Breath sounds: No wheezing or rhonchi.  Musculoskeletal:     Cervical back: Normal range of motion and neck supple.  Neurological:     Mental Status: She is alert.     Cranial Nerves: No cranial nerve deficit.     Gait: Gait normal.  Psychiatric:        Mood and Affect: Mood normal.        Speech: Speech normal.        Behavior: Behavior normal.           Assessment & Plan:   Vertigo- new.  Pt's description of sxs is more consistent w/ vertigo than with hypertensive dizziness.  Sxs are worse w/ turning her head.  The cerumen impaction in her L ear is likely contributing to her sxs.  Attempted to remove  wax but pt did not tolerate so that was aborted.  Discussed using H2O2 at home to soften and remove wax.  Handout provided on modified Epley maneuver.  She is to add daily antihistamine.  Reviewed supportive care and red flags that should prompt return.  Pt expressed understanding and is in agreement w/ plan.

## 2023-07-06 ENCOUNTER — Telehealth: Payer: Self-pay | Admitting: Family Medicine

## 2023-07-06 NOTE — Telephone Encounter (Signed)
I'm glad things are improving.  Things can take awhile to improve but I would continue doing what she's doing- and make sure she's drinking plenty of water.

## 2023-07-06 NOTE — Telephone Encounter (Signed)
Pt reports dizziness is somewhat better, pt went for a walk yesterday and felt fine but when she went home later in the evening it was day, has been doing the cotton ball with peroxide, has been taking allergy med as well. Has also been doing the exercises last 2 days.   Please advise

## 2023-07-06 NOTE — Telephone Encounter (Signed)
Caller name: Charmin Aguiniga  On DPR?: Yes  Call back number: [phone] (mobile)  Provider they see: Sheliah Hatch, MD  Reason for call:   Pt still having some dizziness - advise

## 2023-07-07 NOTE — Telephone Encounter (Signed)
Discussed this with the patient and she will try and wait till Friday then call us with another update

## 2023-07-24 ENCOUNTER — Ambulatory Visit
Admission: RE | Admit: 2023-07-24 | Discharge: 2023-07-24 | Disposition: A | Discharge status: Home / Self Care | Payer: No Typology Code available for payment source | Source: Ambulatory Visit | Attending: Family Medicine | Admitting: Family Medicine

## 2023-07-24 ENCOUNTER — Other Ambulatory Visit: Payer: Self-pay | Admitting: Family Medicine

## 2023-07-24 DIAGNOSIS — Z1231 Encounter for screening mammogram for malignant neoplasm of breast: Secondary | ICD-10-CM

## 2023-07-30 ENCOUNTER — Telehealth: Payer: Self-pay

## 2023-07-30 NOTE — Telephone Encounter (Signed)
Pt asking for Mammo and is "freaking out" would like if you can comment on the results ASAP please thank you

## 2023-08-02 NOTE — Telephone Encounter (Signed)
Pt informed

## 2023-08-02 NOTE — Telephone Encounter (Signed)
Not sure why she is 'freaking out' but her mammo results from 10/24 are normal

## 2023-08-07 ENCOUNTER — Other Ambulatory Visit: Payer: Self-pay | Admitting: Family Medicine

## 2023-08-08 ENCOUNTER — Other Ambulatory Visit: Payer: Self-pay | Admitting: Family Medicine

## 2023-09-24 ENCOUNTER — Ambulatory Visit: Payer: No Typology Code available for payment source | Admitting: Family Medicine

## 2023-09-27 ENCOUNTER — Ambulatory Visit: Payer: No Typology Code available for payment source | Admitting: Family Medicine

## 2023-10-14 ENCOUNTER — Encounter: Payer: No Typology Code available for payment source | Admitting: Family Medicine

## 2023-10-26 ENCOUNTER — Ambulatory Visit: Payer: No Typology Code available for payment source | Admitting: Family Medicine

## 2023-11-08 ENCOUNTER — Encounter: Payer: Self-pay | Admitting: Family Medicine

## 2023-11-08 ENCOUNTER — Ambulatory Visit: Payer: No Typology Code available for payment source | Admitting: Family Medicine

## 2023-11-08 VITALS — BP 130/70 | HR 85 | Temp 98.5°F | Ht 64.0 in | Wt 156.5 lb

## 2023-11-08 DIAGNOSIS — I10 Essential (primary) hypertension: Secondary | ICD-10-CM | POA: Diagnosis not present

## 2023-11-08 DIAGNOSIS — Z23 Encounter for immunization: Secondary | ICD-10-CM

## 2023-11-08 DIAGNOSIS — E785 Hyperlipidemia, unspecified: Secondary | ICD-10-CM

## 2023-11-08 DIAGNOSIS — E663 Overweight: Secondary | ICD-10-CM

## 2023-11-08 DIAGNOSIS — Z114 Encounter for screening for human immunodeficiency virus [HIV]: Secondary | ICD-10-CM | POA: Diagnosis not present

## 2023-11-08 DIAGNOSIS — Z1159 Encounter for screening for other viral diseases: Secondary | ICD-10-CM

## 2023-11-08 DIAGNOSIS — Z1211 Encounter for screening for malignant neoplasm of colon: Secondary | ICD-10-CM | POA: Diagnosis not present

## 2023-11-08 LAB — BASIC METABOLIC PANEL
BUN: 11 mg/dL (ref 6–23)
CO2: 31 meq/L (ref 19–32)
Calcium: 9.9 mg/dL (ref 8.4–10.5)
Chloride: 100 meq/L (ref 96–112)
Creatinine, Ser: 0.76 mg/dL (ref 0.40–1.20)
GFR: 86.26 mL/min (ref >60.00)
Glucose, Bld: 90 mg/dL (ref 70–99)
Potassium: 3.8 meq/L (ref 3.5–5.1)
Sodium: 140 meq/L (ref 135–145)

## 2023-11-08 LAB — CBC WITH DIFFERENTIAL/PLATELET
Basophils Absolute: 0 10*3/uL (ref 0.0–0.1)
Basophils Relative: 0.4 % (ref 0.0–3.0)
Eosinophils Absolute: 0.1 10*3/uL (ref 0.0–0.7)
Eosinophils Relative: 1.6 % (ref 0.0–5.0)
HCT: 40.1 % (ref 36.0–46.0)
Hemoglobin: 13.9 g/dL (ref 12.0–15.0)
Lymphocytes Relative: 49.1 % — ABNORMAL HIGH (ref 12.0–46.0)
Lymphs Abs: 2.4 10*3/uL (ref 0.7–4.0)
MCHC: 34.7 g/dL (ref 30.0–36.0)
MCV: 81.6 fL (ref 78.0–100.0)
Monocytes Absolute: 0.3 10*3/uL (ref 0.1–1.0)
Monocytes Relative: 5.6 % (ref 3.0–12.0)
Neutro Abs: 2.1 10*3/uL (ref 1.4–7.7)
Neutrophils Relative %: 43.3 % (ref 43.0–77.0)
Platelets: 343 10*3/uL (ref 150.0–400.0)
RBC: 4.92 Mil/uL (ref 3.87–5.11)
RDW: 13.8 % (ref 11.5–15.5)
WBC: 4.9 10*3/uL (ref 4.0–10.5)

## 2023-11-08 LAB — HEPATIC FUNCTION PANEL
ALT: 21 U/L (ref 0–35)
AST: 33 U/L (ref 0–37)
Albumin: 4.5 g/dL (ref 3.5–5.2)
Alkaline Phosphatase: 100 U/L (ref 39–117)
Bilirubin, Direct: 0.2 mg/dL (ref 0.0–0.3)
Total Bilirubin: 0.8 mg/dL (ref 0.2–1.2)
Total Protein: 8.2 g/dL (ref 6.0–8.3)

## 2023-11-08 LAB — LIPID PANEL
Cholesterol: 197 mg/dL (ref 0–200)
HDL: 57.9 mg/dL (ref >39.00)
LDL Cholesterol: 125 mg/dL — ABNORMAL HIGH (ref 0–99)
NonHDL: 138.71
Total CHOL/HDL Ratio: 3
Triglycerides: 67 mg/dL (ref 0.0–149.0)
VLDL: 13.4 mg/dL (ref 0.0–40.0)

## 2023-11-08 LAB — TSH: TSH: 0.69 u[IU]/mL (ref 0.35–5.50)

## 2023-11-08 NOTE — Progress Notes (Signed)
   Subjective:    Patient ID: Peggy Duncan, female    DOB: [birth year], 58 y.o.   MRN: [id number]  HPI HTN- chronic problem, on hydrochlorothiazide  12.5mg  daily, Valsartan  80mg  daily w/ adequate control.  Denies CP, SOB, HA's, visual changes, edema.  Overweight- weight is stable.  BMI 26.86  Exercising regularly- going to gym (weight training, running)  Hyperlipidemia- chronic problem, last LDL 144.  Denies abd pain, N/V.   Review of Systems For ROS see HPI     Objective:   Physical Exam        Assessment & Plan:

## 2023-11-08 NOTE — Assessment & Plan Note (Signed)
 Chronic problem.  Last LDL 144.  Pt has been reluctant to take medication in the past.  Now exercising regularly.  Check labs and determine if meds are needed.

## 2023-11-08 NOTE — Patient Instructions (Signed)
Schedule your complete physical in 6 months °We'll notify you of your lab results and make any changes if needed °Keep up the good work on healthy diet and regular exercise- you look great! °Call with any questions or concerns °Stay Safe!  Stay Healthy! °Happy Valentine's Day!! °

## 2023-11-08 NOTE — Assessment & Plan Note (Signed)
 Chronic problem.  On hydrochlorothiazide  12.5mg  daily and Valsartan  80mg  daily w/ adequate control.  Currently asymptomatic.  Check labs due to ARB and diuretic use but no anticipated med changes.  Will follow.

## 2023-11-08 NOTE — Assessment & Plan Note (Signed)
 Ongoing issue for pt.  Weight and BMI are stable.  Pt is going to the gym regularly and working out w/ her daughter who is holding her accountable.  Applauded her efforts.  Will continue to follow.

## 2023-11-09 ENCOUNTER — Encounter: Payer: Self-pay | Admitting: Family Medicine

## 2023-11-09 LAB — HIV ANTIBODY (ROUTINE TESTING W REFLEX): HIV 1&2 Ab, 4th Generation: NONREACTIVE

## 2023-11-09 LAB — HEPATITIS C ANTIBODY: Hepatitis C Ab: NONREACTIVE

## 2024-01-18 ENCOUNTER — Encounter: Payer: Self-pay | Admitting: Family Medicine

## 2024-02-07 ENCOUNTER — Other Ambulatory Visit: Payer: Self-pay | Admitting: Family Medicine

## 2024-03-30 ENCOUNTER — Other Ambulatory Visit: Payer: Self-pay | Admitting: Family Medicine

## 2024-03-30 NOTE — Telephone Encounter (Signed)
 Patient has been scheduled for CPE on 8/21

## 2024-03-30 NOTE — Telephone Encounter (Signed)
 Copied from CRM [id number]. Topic: Clinical - Medication Refill >> Mar 30, 2024  9:10 AM Peggy Duncan wrote: Medication: hydrochlorothiazide  (HYDRODIURIL ) 12.5 MG tablet [[id number]]  Has the patient contacted their pharmacy? No (Agent: If no, request that the patient contact the pharmacy for the refill. If patient does not wish to contact the pharmacy document the reason why and proceed with request.) (Agent: If yes, when and what did the pharmacy advise?)  This is the patient's preferred pharmacy:  [id number] - [place], [place] - [address] [place] [address] Phone: [phone] Fax: [phone]   Is this the correct pharmacy for this prescription? Yes If no, delete pharmacy and type the correct one.   Has the prescription been filled recently? Yes  Is the patient out of the medication? No, 2 weeks left  Has the patient been seen for an appointment in the last year OR does the patient have an upcoming appointment? Yes  Can we respond through MyChart? Yes  Agent: Please be advised that Rx refills may take up to 3 business days. We ask that you follow-up with your pharmacy.

## 2024-03-30 NOTE — Telephone Encounter (Signed)
 Called patient and lvm for her to call back so we can get her physical scheduled in August per Dr. Charis last note. I have sent in her refill for hydrochlorothiazide  as well.

## 2024-05-18 ENCOUNTER — Encounter: Payer: Self-pay | Admitting: Family Medicine

## 2024-05-18 ENCOUNTER — Ambulatory Visit (INDEPENDENT_AMBULATORY_CARE_PROVIDER_SITE_OTHER): Admitting: Family Medicine

## 2024-05-18 VITALS — BP 138/70 | HR 86 | Temp 97.9°F | Ht 64.0 in | Wt 160.6 lb

## 2024-05-18 DIAGNOSIS — Z0000 Encounter for general adult medical examination without abnormal findings: Secondary | ICD-10-CM | POA: Diagnosis not present

## 2024-05-18 DIAGNOSIS — Z23 Encounter for immunization: Secondary | ICD-10-CM | POA: Diagnosis not present

## 2024-05-18 NOTE — Patient Instructions (Signed)
 Follow up in 6 months to recheck blood pressure We'll notify you of your lab results and make any changes if needed Keep up the good work on healthy diet and regular exercise- you look great! SCHEDULE your colonoscopy Call with any questions or concerns Stay Safe!  Stay Healthy! Enjoy the rest of your summer!!

## 2024-05-18 NOTE — Progress Notes (Signed)
   Subjective:    Patient ID: Peggy Duncan, female    DOB: Apr 06, 1966, 58 y.o.   MRN: [id number]  HPI CPE- due for colonoscopy, 2nd shingles vaccine.  Declines PNA.  UTD on mammo, Tdap, pap.  Patient Care Team    Relationship Specialty Notifications Start End  Mahlon Comer BRAVO, MD PCP - General   09/10/10   Carlene Ozell BRAVO., MD Referring Physician Obstetrics and Gynecology  12/05/15     Health Maintenance  Topic Date Due   Zoster Vaccines- Shingrix  (2 of 2) 01/03/2024   COVID-19 Vaccine (1 - 2024-25 season) 06/03/2024 (Originally 05/30/2023)   INFLUENZA VACCINE  12/26/2024 (Originally 04/28/2024)   Pneumococcal Vaccine: 50+ Years (1 of 1 - PCV) 05/18/2025 (Originally 03/31/2015)   Hepatitis B Vaccines 19-59 Average Risk (1 of 3 - 19+ 3-dose series) 05/18/2025 (Originally 03/30/1984)   Colonoscopy  05/18/2025 (Originally 03/30/2010)   MAMMOGRAM  07/23/2024   DTaP/Tdap/Td (2 - Td or Tdap) 12/04/2025   Cervical Cancer Screening (HPV/Pap Cotest)  09/05/2026   Hepatitis C Screening  Completed   HIV Screening  Completed   HPV VACCINES  Aged Out   Meningococcal B Vaccine  Aged Out      Review of Systems Patient reports no vision/ hearing changes, adenopathy,fever, weight change,  persistant/recurrent hoarseness , swallowing issues, chest pain, palpitations, edema, persistant/recurrent cough, hemoptysis, dyspnea (rest/exertional/paroxysmal nocturnal), gastrointestinal bleeding (melena, rectal bleeding), abdominal pain, significant heartburn, bowel changes, GU symptoms (dysuria, hematuria, incontinence), Gyn symptoms (abnormal  bleeding, pain),  syncope, focal weakness, memory loss, numbness & tingling, skin/hair/nail changes, abnormal bruising or bleeding, anxiety, or depression.     Objective:   Physical Exam General Appearance:    Alert, cooperative, no distress, appears stated age  Head:    Normocephalic, without obvious abnormality, atraumatic  Eyes:    PERRL, conjunctiva/corneas clear,  EOM's intact both eyes  Ears:    Normal TM's and external ear canals, both ears  Nose:   Nares normal, septum midline, mucosa normal, no drainage    or sinus tenderness  Throat:   Lips, mucosa, and tongue normal; teeth and gums normal  Neck:   Supple, symmetrical, trachea midline, no adenopathy;    Thyroid : no enlargement/tenderness/nodules  Back:     Symmetric, no curvature, ROM normal, no CVA tenderness  Lungs:     Clear to auscultation bilaterally, respirations unlabored  Chest Wall:    No tenderness or deformity   Heart:    Regular rate and rhythm, S1 and S2 normal, no murmur, rub   or gallop  Breast Exam:    Deferred to mammo  Abdomen:     Soft, non-tender, bowel sounds active all four quadrants,    no masses, no organomegaly  Genitalia:    Deferred to GYN  Rectal:    Extremities:   Extremities normal, atraumatic, no cyanosis or edema  Pulses:   2+ and symmetric all extremities  Skin:   Skin color, texture, turgor normal, no rashes or lesions  Lymph nodes:   Cervical, supraclavicular, and axillary nodes normal  Neurologic:   CNII-XII intact, normal strength, sensation and reflexes    throughout          Assessment & Plan:

## 2024-05-21 ENCOUNTER — Encounter: Payer: Self-pay | Admitting: Family Medicine

## 2024-05-21 NOTE — Assessment & Plan Note (Signed)
 Pt's PE WNL.  UTD on mammo, pap, Tdap.  Due for colonoscopy.  Shingles vaccine given today.  Check labs.  Anticipatory guidance provided.

## 2024-08-05 ENCOUNTER — Other Ambulatory Visit: Payer: Self-pay | Admitting: Family Medicine

## 2024-09-12 ENCOUNTER — Other Ambulatory Visit: Payer: Self-pay | Admitting: Family Medicine

## 2024-09-12 DIAGNOSIS — Z1231 Encounter for screening mammogram for malignant neoplasm of breast: Secondary | ICD-10-CM

## 2024-10-03 ENCOUNTER — Other Ambulatory Visit: Payer: Self-pay | Admitting: Family Medicine

## 2024-10-05 ENCOUNTER — Ambulatory Visit
Admission: RE | Admit: 2024-10-05 | Discharge: 2024-10-05 | Disposition: A | Discharge status: Home / Self Care | Source: Ambulatory Visit | Attending: Family Medicine | Admitting: Family Medicine

## 2024-10-05 DIAGNOSIS — Z1231 Encounter for screening mammogram for malignant neoplasm of breast: Secondary | ICD-10-CM
